# Patient Record
Sex: Female | Born: 1990 | Race: White | Hispanic: No | Marital: Single | Smoking: Current every day smoker
Health system: Southern US, Community
[De-identification: ages and names within clinical notes are randomized; demographics above are authoritative.]

## PROBLEM LIST (undated history)

## (undated) DIAGNOSIS — G8220 Paraplegia, unspecified: Secondary | ICD-10-CM

## (undated) DIAGNOSIS — N319 Neuromuscular dysfunction of bladder, unspecified: Secondary | ICD-10-CM

## (undated) DIAGNOSIS — K592 Neurogenic bowel, not elsewhere classified: Secondary | ICD-10-CM

## (undated) DIAGNOSIS — N318 Other neuromuscular dysfunction of bladder: Secondary | ICD-10-CM

## (undated) DIAGNOSIS — E669 Obesity, unspecified: Secondary | ICD-10-CM

## (undated) DIAGNOSIS — G959 Disease of spinal cord, unspecified: Secondary | ICD-10-CM

## (undated) HISTORY — PX: ANTERIOR CRUCIATE LIGAMENT REPAIR: SHX115

---

## 2015-11-03 DIAGNOSIS — K592 Neurogenic bowel, not elsewhere classified: Secondary | ICD-10-CM

## 2015-11-03 HISTORY — DX: Neurogenic bowel, not elsewhere classified: K59.2

## 2015-11-06 ENCOUNTER — Encounter (HOSPITAL_COMMUNITY): Admission: EM | Disposition: A | Discharge status: Rehabilitation Facility | Payer: Self-pay | Source: Home / Self Care

## 2015-11-06 ENCOUNTER — Inpatient Hospital Stay (HOSPITAL_COMMUNITY): Payer: Worker's Compensation

## 2015-11-06 ENCOUNTER — Encounter (HOSPITAL_COMMUNITY): Payer: Self-pay | Admitting: Radiology

## 2015-11-06 ENCOUNTER — Emergency Department (HOSPITAL_COMMUNITY): Payer: Worker's Compensation

## 2015-11-06 ENCOUNTER — Inpatient Hospital Stay (HOSPITAL_COMMUNITY): Payer: Worker's Compensation | Admitting: Certified Registered Nurse Anesthetist

## 2015-11-06 ENCOUNTER — Inpatient Hospital Stay (HOSPITAL_COMMUNITY)
Admission: EM | Admit: 2015-11-06 | Discharge: 2015-11-12 | DRG: 460 | Disposition: A | Discharge status: Rehabilitation Facility | Payer: Worker's Compensation | Attending: Neurosurgery | Admitting: Neurosurgery

## 2015-11-06 DIAGNOSIS — D62 Acute posthemorrhagic anemia: Secondary | ICD-10-CM | POA: Diagnosis not present

## 2015-11-06 DIAGNOSIS — S32039A Unspecified fracture of third lumbar vertebra, initial encounter for closed fracture: Secondary | ICD-10-CM | POA: Diagnosis present

## 2015-11-06 DIAGNOSIS — M25572 Pain in left ankle and joints of left foot: Secondary | ICD-10-CM | POA: Diagnosis present

## 2015-11-06 DIAGNOSIS — S32009A Unspecified fracture of unspecified lumbar vertebra, initial encounter for closed fracture: Secondary | ICD-10-CM | POA: Diagnosis present

## 2015-11-06 DIAGNOSIS — R402142 Coma scale, eyes open, spontaneous, at arrival to emergency department: Secondary | ICD-10-CM | POA: Diagnosis present

## 2015-11-06 DIAGNOSIS — F4321 Adjustment disorder with depressed mood: Secondary | ICD-10-CM | POA: Diagnosis not present

## 2015-11-06 DIAGNOSIS — S22009A Unspecified fracture of unspecified thoracic vertebra, initial encounter for closed fracture: Secondary | ICD-10-CM

## 2015-11-06 DIAGNOSIS — T1490XA Injury, unspecified, initial encounter: Secondary | ICD-10-CM

## 2015-11-06 DIAGNOSIS — F17210 Nicotine dependence, cigarettes, uncomplicated: Secondary | ICD-10-CM | POA: Diagnosis present

## 2015-11-06 DIAGNOSIS — R402362 Coma scale, best motor response, obeys commands, at arrival to emergency department: Secondary | ICD-10-CM | POA: Diagnosis present

## 2015-11-06 DIAGNOSIS — S32029A Unspecified fracture of second lumbar vertebra, initial encounter for closed fracture: Secondary | ICD-10-CM | POA: Diagnosis present

## 2015-11-06 DIAGNOSIS — Z6841 Body Mass Index (BMI) 40.0 and over, adult: Secondary | ICD-10-CM | POA: Diagnosis not present

## 2015-11-06 DIAGNOSIS — S30811A Abrasion of abdominal wall, initial encounter: Secondary | ICD-10-CM | POA: Diagnosis present

## 2015-11-06 DIAGNOSIS — R402252 Coma scale, best verbal response, oriented, at arrival to emergency department: Secondary | ICD-10-CM | POA: Diagnosis present

## 2015-11-06 DIAGNOSIS — T07XXXA Unspecified multiple injuries, initial encounter: Secondary | ICD-10-CM

## 2015-11-06 DIAGNOSIS — N83209 Unspecified ovarian cyst, unspecified side: Secondary | ICD-10-CM | POA: Diagnosis present

## 2015-11-06 DIAGNOSIS — S32019A Unspecified fracture of first lumbar vertebra, initial encounter for closed fracture: Secondary | ICD-10-CM | POA: Diagnosis present

## 2015-11-06 DIAGNOSIS — S060X0A Concussion without loss of consciousness, initial encounter: Secondary | ICD-10-CM | POA: Diagnosis present

## 2015-11-06 DIAGNOSIS — R339 Retention of urine, unspecified: Secondary | ICD-10-CM | POA: Diagnosis present

## 2015-11-06 DIAGNOSIS — G9581 Conus medullaris syndrome: Secondary | ICD-10-CM | POA: Diagnosis present

## 2015-11-06 DIAGNOSIS — S22081D Stable burst fracture of T11-T12 vertebra, subsequent encounter for fracture with routine healing: Secondary | ICD-10-CM | POA: Diagnosis not present

## 2015-11-06 DIAGNOSIS — G4700 Insomnia, unspecified: Secondary | ICD-10-CM | POA: Diagnosis present

## 2015-11-06 DIAGNOSIS — M549 Dorsalgia, unspecified: Secondary | ICD-10-CM | POA: Diagnosis present

## 2015-11-06 DIAGNOSIS — R413 Other amnesia: Secondary | ICD-10-CM | POA: Diagnosis present

## 2015-11-06 DIAGNOSIS — N390 Urinary tract infection, site not specified: Secondary | ICD-10-CM | POA: Diagnosis not present

## 2015-11-06 DIAGNOSIS — K560 Paralytic ileus: Secondary | ICD-10-CM | POA: Diagnosis not present

## 2015-11-06 DIAGNOSIS — S32049A Unspecified fracture of fourth lumbar vertebra, initial encounter for closed fracture: Secondary | ICD-10-CM | POA: Diagnosis present

## 2015-11-06 DIAGNOSIS — Z981 Arthrodesis status: Secondary | ICD-10-CM | POA: Diagnosis not present

## 2015-11-06 DIAGNOSIS — K567 Ileus, unspecified: Secondary | ICD-10-CM

## 2015-11-06 DIAGNOSIS — G8220 Paraplegia, unspecified: Secondary | ICD-10-CM | POA: Diagnosis present

## 2015-11-06 DIAGNOSIS — S060X9A Concussion with loss of consciousness of unspecified duration, initial encounter: Secondary | ICD-10-CM | POA: Diagnosis present

## 2015-11-06 DIAGNOSIS — S22081A Stable burst fracture of T11-T12 vertebra, initial encounter for closed fracture: Secondary | ICD-10-CM | POA: Diagnosis present

## 2015-11-06 DIAGNOSIS — S22081S Stable burst fracture of T11-T12 vertebra, sequela: Secondary | ICD-10-CM | POA: Diagnosis not present

## 2015-11-06 DIAGNOSIS — G839 Paralytic syndrome, unspecified: Secondary | ICD-10-CM

## 2015-11-06 DIAGNOSIS — G834 Cauda equina syndrome: Secondary | ICD-10-CM | POA: Diagnosis not present

## 2015-11-06 DIAGNOSIS — Z419 Encounter for procedure for purposes other than remedying health state, unspecified: Secondary | ICD-10-CM

## 2015-11-06 DIAGNOSIS — K592 Neurogenic bowel, not elsewhere classified: Secondary | ICD-10-CM | POA: Diagnosis present

## 2015-11-06 DIAGNOSIS — N319 Neuromuscular dysfunction of bladder, unspecified: Secondary | ICD-10-CM | POA: Diagnosis not present

## 2015-11-06 DIAGNOSIS — G959 Disease of spinal cord, unspecified: Secondary | ICD-10-CM | POA: Diagnosis not present

## 2015-11-06 DIAGNOSIS — S01402A Unspecified open wound of left cheek and temporomandibular area, initial encounter: Secondary | ICD-10-CM | POA: Diagnosis present

## 2015-11-06 DIAGNOSIS — B9620 Unspecified Escherichia coli [E. coli] as the cause of diseases classified elsewhere: Secondary | ICD-10-CM | POA: Diagnosis not present

## 2015-11-06 DIAGNOSIS — R609 Edema, unspecified: Secondary | ICD-10-CM | POA: Diagnosis not present

## 2015-11-06 DIAGNOSIS — G8310 Monoplegia of lower limb affecting unspecified side: Secondary | ICD-10-CM

## 2015-11-06 HISTORY — PX: POSTERIOR LUMBAR FUSION 4 LEVEL: SHX6037

## 2015-11-06 LAB — PREPARE FRESH FROZEN PLASMA
Unit division: 0
Unit division: 0

## 2015-11-06 LAB — URINALYSIS, ROUTINE W REFLEX MICROSCOPIC
Bilirubin Urine: NEGATIVE
GLUCOSE, UA: NEGATIVE mg/dL
KETONES UR: NEGATIVE mg/dL
LEUKOCYTES UA: NEGATIVE
Nitrite: NEGATIVE
PH: 5 (ref 5.0–8.0)
Protein, ur: NEGATIVE mg/dL
Specific Gravity, Urine: >1.046 — ABNORMAL HIGH (ref 1.005–1.030)

## 2015-11-06 LAB — TYPE AND SCREEN
ABO/RH(D): B POS
ANTIBODY SCREEN: NEGATIVE
UNIT DIVISION: 0
Unit division: 0

## 2015-11-06 LAB — COMPREHENSIVE METABOLIC PANEL
ALBUMIN: 4 g/dL (ref 3.5–5.0)
ALK PHOS: 65 U/L (ref 38–126)
ALT: 31 U/L (ref 14–54)
ANION GAP: 12 (ref 5–15)
AST: 47 U/L — ABNORMAL HIGH (ref 15–41)
BILIRUBIN TOTAL: 0.3 mg/dL (ref 0.3–1.2)
BUN: 14 mg/dL (ref 6–20)
CALCIUM: 9.7 mg/dL (ref 8.9–10.3)
CO2: 22 mmol/L (ref 22–32)
Chloride: 106 mmol/L (ref 101–111)
Creatinine, Ser: 0.74 mg/dL (ref 0.44–1.00)
GFR calc non Af Amer: >60 mL/min (ref >60)
GLUCOSE: 119 mg/dL — AB (ref 65–99)
Potassium: 3.3 mmol/L — ABNORMAL LOW (ref 3.5–5.1)
Sodium: 140 mmol/L (ref 135–145)
TOTAL PROTEIN: 7.4 g/dL (ref 6.5–8.1)

## 2015-11-06 LAB — CBC
HEMATOCRIT: 41 % (ref 36.0–46.0)
HEMOGLOBIN: 13.6 g/dL (ref 12.0–15.0)
MCH: 28.3 pg (ref 26.0–34.0)
MCHC: 33.2 g/dL (ref 30.0–36.0)
MCV: 85.4 fL (ref 78.0–100.0)
Platelets: 291 10*3/uL (ref 150–400)
RBC: 4.8 MIL/uL (ref 3.87–5.11)
RDW: 14.1 % (ref 11.5–15.5)
WBC: 17 10*3/uL — ABNORMAL HIGH (ref 4.0–10.5)

## 2015-11-06 LAB — URINE MICROSCOPIC-ADD ON: WBC, UA: NONE SEEN WBC/hpf (ref 0–5)

## 2015-11-06 LAB — ETHANOL: Alcohol, Ethyl (B): <5 mg/dL (ref <5)

## 2015-11-06 LAB — CDS SEROLOGY

## 2015-11-06 LAB — PROTIME-INR
INR: 1.02 (ref 0.00–1.49)
PROTHROMBIN TIME: 13.6 s (ref 11.6–15.2)

## 2015-11-06 LAB — MRSA PCR SCREENING: MRSA by PCR: NEGATIVE

## 2015-11-06 LAB — ABO/RH: ABO/RH(D): B POS

## 2015-11-06 SURGERY — POSTERIOR LUMBAR FUSION 4 LEVEL
Anesthesia: General

## 2015-11-06 MED ORDER — FENTANYL CITRATE (PF) 250 MCG/5ML IJ SOLN
INTRAMUSCULAR | Status: AC
  Filled 2015-11-06: qty 5

## 2015-11-06 MED ORDER — LIDOCAINE HCL (CARDIAC) 20 MG/ML IV SOLN
INTRAVENOUS | Status: DC | PRN
  Administered 2015-11-06: 30 mg via INTRAVENOUS

## 2015-11-06 MED ORDER — DIPHENHYDRAMINE HCL 50 MG/ML IJ SOLN: 12.5000 mg | Freq: Four times a day (QID) | INTRAMUSCULAR | Status: DC | PRN

## 2015-11-06 MED ORDER — ONDANSETRON HCL 4 MG/2ML IJ SOLN
4.0000 mg | Freq: Four times a day (QID) | INTRAMUSCULAR | Status: DC | PRN
  Administered 2015-11-08 – 2015-11-10 (×3): 4 mg via INTRAVENOUS
  Filled 2015-11-06 (×3): qty 2

## 2015-11-06 MED ORDER — SENNOSIDES-DOCUSATE SODIUM 8.6-50 MG PO TABS: 1.0000 | ORAL_TABLET | Freq: Every evening | ORAL | Status: DC | PRN

## 2015-11-06 MED ORDER — BUPIVACAINE HCL (PF) 0.5 % IJ SOLN
INTRAMUSCULAR | Status: DC | PRN
  Administered 2015-11-06: 30 mL

## 2015-11-06 MED ORDER — SODIUM CHLORIDE 0.9 % IJ SOLN
3.0000 mL | Freq: Two times a day (BID) | INTRAMUSCULAR | Status: DC
  Administered 2015-11-06 – 2015-11-12 (×8): 3 mL via INTRAVENOUS

## 2015-11-06 MED ORDER — SODIUM CHLORIDE 0.9 % IV SOLN: 250.0000 mL | INTRAVENOUS | Status: DC

## 2015-11-06 MED ORDER — CEFAZOLIN SODIUM-DEXTROSE 2-3 GM-% IV SOLR
INTRAVENOUS | Status: DC | PRN
  Administered 2015-11-06: 2 g via INTRAVENOUS

## 2015-11-06 MED ORDER — ONDANSETRON HCL 4 MG PO TABS: 4.0000 mg | ORAL_TABLET | Freq: Four times a day (QID) | ORAL | Status: DC | PRN

## 2015-11-06 MED ORDER — PANTOPRAZOLE SODIUM 40 MG PO TBEC
40.0000 mg | DELAYED_RELEASE_TABLET | Freq: Every day | ORAL | Status: DC
  Administered 2015-11-07 – 2015-11-12 (×6): 40 mg via ORAL
  Filled 2015-11-06 (×6): qty 1

## 2015-11-06 MED ORDER — ONDANSETRON HCL 4 MG/2ML IJ SOLN
INTRAMUSCULAR | Status: AC
  Filled 2015-11-06: qty 2

## 2015-11-06 MED ORDER — DOCUSATE SODIUM 100 MG PO CAPS
100.0000 mg | ORAL_CAPSULE | Freq: Two times a day (BID) | ORAL | Status: DC
  Administered 2015-11-06 – 2015-11-12 (×12): 100 mg via ORAL
  Filled 2015-11-06 (×12): qty 1

## 2015-11-06 MED ORDER — LACTATED RINGERS IV SOLN
INTRAVENOUS | Status: DC | PRN
  Administered 2015-11-06 (×2): via INTRAVENOUS

## 2015-11-06 MED ORDER — THROMBIN 20000 UNITS EX SOLR
CUTANEOUS | Status: DC | PRN
  Administered 2015-11-06: 12:00:00 via TOPICAL

## 2015-11-06 MED ORDER — MIDAZOLAM HCL 2 MG/2ML IJ SOLN
INTRAMUSCULAR | Status: AC
  Filled 2015-11-06: qty 2

## 2015-11-06 MED ORDER — MIDAZOLAM HCL 5 MG/5ML IJ SOLN
INTRAMUSCULAR | Status: DC | PRN
  Administered 2015-11-06: 1 mg via INTRAVENOUS

## 2015-11-06 MED ORDER — MORPHINE SULFATE (PF) 2 MG/ML IV SOLN: 1.0000 mg | INTRAVENOUS | Status: DC | PRN

## 2015-11-06 MED ORDER — MIDAZOLAM HCL 2 MG/2ML IJ SOLN: 0.5000 mg | Freq: Once | INTRAMUSCULAR | Status: DC | PRN

## 2015-11-06 MED ORDER — HYDROCODONE-ACETAMINOPHEN 5-325 MG PO TABS
1.0000 | ORAL_TABLET | ORAL | Status: DC | PRN
  Administered 2015-11-07: 2 via ORAL
  Administered 2015-11-07: 1 via ORAL
  Administered 2015-11-08 – 2015-11-10 (×3): 2 via ORAL
  Filled 2015-11-06: qty 1
  Filled 2015-11-06 (×4): qty 2

## 2015-11-06 MED ORDER — FENTANYL CITRATE (PF) 100 MCG/2ML IJ SOLN
INTRAMUSCULAR | Status: DC | PRN
  Administered 2015-11-06: 50 ug via INTRAVENOUS
  Administered 2015-11-06: 100 ug via INTRAVENOUS
  Administered 2015-11-06: 50 ug via INTRAVENOUS
  Administered 2015-11-06: 100 ug via INTRAVENOUS
  Administered 2015-11-06: 50 ug via INTRAVENOUS
  Administered 2015-11-06: 100 ug via INTRAVENOUS
  Administered 2015-11-06: 150 ug via INTRAVENOUS

## 2015-11-06 MED ORDER — FENTANYL CITRATE (PF) 100 MCG/2ML IJ SOLN: 100.0000 ug | Freq: Once | INTRAMUSCULAR | Status: DC

## 2015-11-06 MED ORDER — NALOXONE HCL 0.4 MG/ML IJ SOLN: 0.4000 mg | INTRAMUSCULAR | Status: DC | PRN

## 2015-11-06 MED ORDER — PANTOPRAZOLE SODIUM 40 MG IV SOLR: 40.0000 mg | Freq: Every day | INTRAVENOUS | Status: DC

## 2015-11-06 MED ORDER — BACITRACIN ZINC 500 UNIT/GM EX OINT
TOPICAL_OINTMENT | CUTANEOUS | Status: DC | PRN
  Administered 2015-11-06: 1 via TOPICAL

## 2015-11-06 MED ORDER — ROCURONIUM BROMIDE 100 MG/10ML IV SOLN
INTRAVENOUS | Status: DC | PRN
  Administered 2015-11-06: 50 mg via INTRAVENOUS

## 2015-11-06 MED ORDER — DEXAMETHASONE SODIUM PHOSPHATE 10 MG/ML IJ SOLN
INTRAMUSCULAR | Status: DC | PRN
  Administered 2015-11-06: 10 mg via INTRAVENOUS

## 2015-11-06 MED ORDER — PHENOL 1.4 % MT LIQD: 1.0000 | OROMUCOSAL | Status: DC | PRN

## 2015-11-06 MED ORDER — MAGNESIUM CITRATE PO SOLN
1.0000 | Freq: Once | ORAL | Status: DC | PRN
Start: 2015-11-06 — End: 2015-11-12
  Filled 2015-11-06: qty 296

## 2015-11-06 MED ORDER — HYDROMORPHONE HCL 1 MG/ML IJ SOLN: 0.2500 mg | INTRAMUSCULAR | Status: DC | PRN

## 2015-11-06 MED ORDER — FENTANYL CITRATE (PF) 100 MCG/2ML IJ SOLN
INTRAMUSCULAR | Status: AC
  Filled 2015-11-06: qty 2

## 2015-11-06 MED ORDER — 0.9 % SODIUM CHLORIDE (POUR BTL) OPTIME
TOPICAL | Status: DC | PRN
  Administered 2015-11-06: 1000 mL

## 2015-11-06 MED ORDER — LIDOCAINE HCL (CARDIAC) 20 MG/ML IV SOLN
INTRAVENOUS | Status: AC
  Filled 2015-11-06: qty 5

## 2015-11-06 MED ORDER — ONDANSETRON HCL 4 MG/2ML IJ SOLN: 4.0000 mg | Freq: Once | INTRAMUSCULAR | Status: DC

## 2015-11-06 MED ORDER — NEOSTIGMINE METHYLSULFATE 10 MG/10ML IV SOLN
INTRAVENOUS | Status: DC | PRN
  Administered 2015-11-06: 3 mg via INTRAVENOUS

## 2015-11-06 MED ORDER — ONDANSETRON HCL 4 MG/2ML IJ SOLN
INTRAMUSCULAR | Status: AC
Start: 2015-11-06 — End: 2015-11-06
  Filled 2015-11-06: qty 2

## 2015-11-06 MED ORDER — SODIUM CHLORIDE 0.9 % IJ SOLN: 9.0000 mL | INTRAMUSCULAR | Status: DC | PRN

## 2015-11-06 MED ORDER — ZOLPIDEM TARTRATE 5 MG PO TABS
5.0000 mg | ORAL_TABLET | Freq: Every evening | ORAL | Status: DC | PRN
  Administered 2015-11-11: 5 mg via ORAL
  Filled 2015-11-06: qty 1

## 2015-11-06 MED ORDER — SENNA 8.6 MG PO TABS
1.0000 | ORAL_TABLET | Freq: Two times a day (BID) | ORAL | Status: DC
  Administered 2015-11-06 – 2015-11-12 (×11): 8.6 mg via ORAL
  Filled 2015-11-06 (×12): qty 1

## 2015-11-06 MED ORDER — PROPOFOL 10 MG/ML IV BOLUS
INTRAVENOUS | Status: AC
  Filled 2015-11-06: qty 20

## 2015-11-06 MED ORDER — PROMETHAZINE HCL 25 MG/ML IJ SOLN
6.2500 mg | INTRAMUSCULAR | Status: DC | PRN
Start: 2015-11-06 — End: 2015-11-06

## 2015-11-06 MED ORDER — LIDOCAINE-EPINEPHRINE 0.5 %-1:200000 IJ SOLN
INTRAMUSCULAR | Status: DC | PRN
  Administered 2015-11-06: 20 mL

## 2015-11-06 MED ORDER — DIAZEPAM 5 MG PO TABS
5.0000 mg | ORAL_TABLET | Freq: Four times a day (QID) | ORAL | Status: DC | PRN
  Administered 2015-11-07 – 2015-11-12 (×8): 5 mg via ORAL
  Filled 2015-11-06 (×8): qty 1

## 2015-11-06 MED ORDER — KCL IN DEXTROSE-NACL 40-5-0.45 MEQ/L-%-% IV SOLN
INTRAVENOUS | Status: DC
  Administered 2015-11-06 – 2015-11-08 (×4): via INTRAVENOUS
  Filled 2015-11-06 (×8): qty 1000

## 2015-11-06 MED ORDER — MORPHINE SULFATE 2 MG/ML IV SOLN
INTRAVENOUS | Status: DC
  Administered 2015-11-06 (×2): via INTRAVENOUS
  Administered 2015-11-06: 1.5 mg via INTRAVENOUS
  Administered 2015-11-07: 9 mg via INTRAVENOUS
  Administered 2015-11-07: 8 mg via INTRAVENOUS
  Administered 2015-11-07: 6 mg via INTRAVENOUS
  Administered 2015-11-07: 9 mg via INTRAVENOUS
  Administered 2015-11-07: 12:00:00 via INTRAVENOUS
  Administered 2015-11-07: 12 mg via INTRAVENOUS
  Administered 2015-11-07: 10.5 mg via INTRAVENOUS
  Administered 2015-11-08: 6 mg via INTRAVENOUS
  Administered 2015-11-08: 05:00:00 via INTRAVENOUS
  Administered 2015-11-08: 10.5 mg via INTRAVENOUS
  Administered 2015-11-08: 17.3 mg via INTRAVENOUS
  Administered 2015-11-08: 32 mg via INTRAVENOUS
  Administered 2015-11-08: 21:00:00 via INTRAVENOUS
  Administered 2015-11-08 – 2015-11-09 (×2): 7.5 mg via INTRAVENOUS
  Administered 2015-11-09: 21 mg via INTRAVENOUS
  Administered 2015-11-09: 9 mg via INTRAVENOUS
  Administered 2015-11-09: 17:00:00 via INTRAVENOUS
  Administered 2015-11-09: 0 mg via INTRAVENOUS
  Administered 2015-11-09: 12 mg via INTRAVENOUS
  Administered 2015-11-09: 10.29 mg via INTRAVENOUS
  Administered 2015-11-10: 6 mg via INTRAVENOUS
  Administered 2015-11-10: 19.5 mg via INTRAVENOUS
  Filled 2015-11-06 (×5): qty 25

## 2015-11-06 MED ORDER — MORPHINE SULFATE 2 MG/ML IV SOLN
INTRAVENOUS | Status: AC
  Filled 2015-11-06: qty 25

## 2015-11-06 MED ORDER — GLYCOPYRROLATE 0.2 MG/ML IJ SOLN
INTRAMUSCULAR | Status: DC | PRN
  Administered 2015-11-06: 0.4 mg via INTRAVENOUS

## 2015-11-06 MED ORDER — MEPERIDINE HCL 25 MG/ML IJ SOLN: 6.2500 mg | INTRAMUSCULAR | Status: DC | PRN

## 2015-11-06 MED ORDER — POLYETHYLENE GLYCOL 3350 17 G PO PACK
17.0000 g | PACK | Freq: Every day | ORAL | Status: DC
  Administered 2015-11-07 – 2015-11-12 (×5): 17 g via ORAL
  Filled 2015-11-06 (×6): qty 1

## 2015-11-06 MED ORDER — ACETAMINOPHEN 650 MG RE SUPP: 650.0000 mg | RECTAL | Status: DC | PRN

## 2015-11-06 MED ORDER — DIPHENHYDRAMINE HCL 12.5 MG/5ML PO ELIX: 12.5000 mg | ORAL_SOLUTION | Freq: Four times a day (QID) | ORAL | Status: DC | PRN

## 2015-11-06 MED ORDER — SODIUM CHLORIDE 0.9 % IJ SOLN
3.0000 mL | INTRAMUSCULAR | Status: DC | PRN
  Administered 2015-11-10: 3 mL via INTRAVENOUS
  Filled 2015-11-06: qty 3

## 2015-11-06 MED ORDER — ENOXAPARIN SODIUM 30 MG/0.3ML ~~LOC~~ SOLN: 30.0000 mg | Freq: Two times a day (BID) | SUBCUTANEOUS | Status: DC

## 2015-11-06 MED ORDER — SUCCINYLCHOLINE CHLORIDE 20 MG/ML IJ SOLN
INTRAMUSCULAR | Status: DC | PRN
  Administered 2015-11-06: 140 mg via INTRAVENOUS

## 2015-11-06 MED ORDER — ACETAMINOPHEN 325 MG PO TABS
650.0000 mg | ORAL_TABLET | ORAL | Status: DC | PRN
  Administered 2015-11-10 – 2015-11-12 (×3): 650 mg via ORAL
  Filled 2015-11-06 (×3): qty 2

## 2015-11-06 MED ORDER — ONDANSETRON HCL 4 MG/2ML IJ SOLN
INTRAMUSCULAR | Status: DC | PRN
  Administered 2015-11-06: 4 mg via INTRAVENOUS

## 2015-11-06 MED ORDER — FENTANYL CITRATE (PF) 100 MCG/2ML IJ SOLN
INTRAMUSCULAR | Status: AC | PRN
  Administered 2015-11-06 (×2): 100 ug via INTRAVENOUS

## 2015-11-06 MED ORDER — BISACODYL 5 MG PO TBEC: 5.0000 mg | DELAYED_RELEASE_TABLET | Freq: Every day | ORAL | Status: DC | PRN

## 2015-11-06 MED ORDER — MENTHOL 3 MG MT LOZG: 1.0000 | LOZENGE | OROMUCOSAL | Status: DC | PRN

## 2015-11-06 MED ORDER — ALUM & MAG HYDROXIDE-SIMETH 200-200-20 MG/5ML PO SUSP: 30.0000 mL | Freq: Four times a day (QID) | ORAL | Status: DC | PRN

## 2015-11-06 MED ORDER — ROCURONIUM BROMIDE 50 MG/5ML IV SOLN
INTRAVENOUS | Status: AC
Start: 2015-11-06 — End: 2015-11-06
  Filled 2015-11-06: qty 1

## 2015-11-06 MED ORDER — PROPOFOL 10 MG/ML IV BOLUS
INTRAVENOUS | Status: DC | PRN
  Administered 2015-11-06: 100 mg via INTRAVENOUS

## 2015-11-06 MED ORDER — IOHEXOL 300 MG/ML  SOLN
100.0000 mL | Freq: Once | INTRAMUSCULAR | Status: AC | PRN
  Administered 2015-11-06: 100 mL via INTRAVENOUS

## 2015-11-06 SURGICAL SUPPLY — 66 items
BAG DECANTER FOR FLEXI CONT (MISCELLANEOUS) ×3 IMPLANT
BENZOIN TINCTURE PRP APPL 2/3 (GAUZE/BANDAGES/DRESSINGS) IMPLANT
BLADE CLIPPER SURG (BLADE) IMPLANT
BUR MATCHSTICK NEURO 3.0 LAGG (BURR) ×3 IMPLANT
BUR PRECISION FLUTE 5.0 (BURR) ×3 IMPLANT
CANISTER SUCT 3000ML PPV (MISCELLANEOUS) ×3 IMPLANT
CAP LOCKING THREADED (Cap) ×24 IMPLANT
CLOSURE WOUND 1/2 X4 (GAUZE/BANDAGES/DRESSINGS)
CONT SPEC 4OZ CLIKSEAL STRL BL (MISCELLANEOUS) ×3 IMPLANT
COVER BACK TABLE 60X90IN (DRAPES) IMPLANT
DECANTER SPIKE VIAL GLASS SM (MISCELLANEOUS) ×3 IMPLANT
DRAPE C-ARM 42X72 X-RAY (DRAPES) ×6 IMPLANT
DRAPE C-ARMOR (DRAPES) ×3 IMPLANT
DRAPE LAPAROTOMY 100X72X124 (DRAPES) ×3 IMPLANT
DRAPE POUCH INSTRU U-SHP 10X18 (DRAPES) ×3 IMPLANT
DRAPE SURG 17X23 STRL (DRAPES) ×3 IMPLANT
DRSG ADAPTIC 3X8 NADH LF (GAUZE/BANDAGES/DRESSINGS) ×6 IMPLANT
DRSG OPSITE POSTOP 4X10 (GAUZE/BANDAGES/DRESSINGS) ×3 IMPLANT
DURAPREP 26ML APPLICATOR (WOUND CARE) ×3 IMPLANT
ELECT BLADE 4.0 EZ CLEAN MEGAD (MISCELLANEOUS) ×3
ELECT REM PT RETURN 9FT ADLT (ELECTROSURGICAL) ×3
ELECTRODE BLDE 4.0 EZ CLN MEGD (MISCELLANEOUS) ×1 IMPLANT
ELECTRODE REM PT RTRN 9FT ADLT (ELECTROSURGICAL) ×1 IMPLANT
GAUZE SPONGE 4X4 12PLY STRL (GAUZE/BANDAGES/DRESSINGS) IMPLANT
GAUZE SPONGE 4X4 16PLY XRAY LF (GAUZE/BANDAGES/DRESSINGS) IMPLANT
GLOVE BIO SURGEON STRL SZ7 (GLOVE) ×9 IMPLANT
GLOVE ECLIPSE 6.5 STRL STRAW (GLOVE) ×6 IMPLANT
GLOVE EXAM NITRILE LRG STRL (GLOVE) IMPLANT
GLOVE EXAM NITRILE MD LF STRL (GLOVE) IMPLANT
GLOVE EXAM NITRILE XL STR (GLOVE) IMPLANT
GLOVE EXAM NITRILE XS STR PU (GLOVE) IMPLANT
GLOVE INDICATOR 7.0 STRL GRN (GLOVE) ×9 IMPLANT
GLOVE INDICATOR 7.5 STRL GRN (GLOVE) ×6 IMPLANT
GOWN STRL REUS W/ TWL LRG LVL3 (GOWN DISPOSABLE) ×2 IMPLANT
GOWN STRL REUS W/ TWL XL LVL3 (GOWN DISPOSABLE) IMPLANT
GOWN STRL REUS W/TWL 2XL LVL3 (GOWN DISPOSABLE) IMPLANT
GOWN STRL REUS W/TWL LRG LVL3 (GOWN DISPOSABLE) ×4
GOWN STRL REUS W/TWL XL LVL3 (GOWN DISPOSABLE)
GRAFT BN 10X1XDBM MAGNIFUSE (Bone Implant) ×1 IMPLANT
GRAFT BONE MAGNIFUSE 1X10CM (Bone Implant) ×2 IMPLANT
KIT BASIN OR (CUSTOM PROCEDURE TRAY) ×3 IMPLANT
KIT POSITION SURG JACKSON T1 (MISCELLANEOUS) ×3 IMPLANT
KIT ROOM TURNOVER OR (KITS) ×3 IMPLANT
LIQUID BAND (GAUZE/BANDAGES/DRESSINGS) ×3 IMPLANT
MILL MEDIUM DISP (BLADE) ×6 IMPLANT
NEEDLE HYPO 25X1 1.5 SAFETY (NEEDLE) ×3 IMPLANT
NEEDLE SPNL 18GX3.5 QUINCKE PK (NEEDLE) IMPLANT
NS IRRIG 1000ML POUR BTL (IV SOLUTION) ×3 IMPLANT
PACK LAMINECTOMY NEURO (CUSTOM PROCEDURE TRAY) ×3 IMPLANT
PAD ARMBOARD 7.5X6 YLW CONV (MISCELLANEOUS) ×9 IMPLANT
ROD CREO STRAIGHT 125MM (Rod) ×6 IMPLANT
SCREW 5.0X40MM (Screw) ×3 IMPLANT
SCREW POLY THRD CREO 6.5X40 (Screw) ×21 IMPLANT
SPONGE LAP 4X18 X RAY DECT (DISPOSABLE) IMPLANT
SPONGE SURGIFOAM ABS GEL 100 (HEMOSTASIS) ×3 IMPLANT
STAPLER VISISTAT 35W (STAPLE) ×3 IMPLANT
STRIP CLOSURE SKIN 1/2X4 (GAUZE/BANDAGES/DRESSINGS) IMPLANT
SUT ETHILON 3 0 PS 1 (SUTURE) ×3 IMPLANT
SUT PROLENE 6 0 BV (SUTURE) IMPLANT
SUT VIC AB 0 CT1 18XCR BRD8 (SUTURE) ×2 IMPLANT
SUT VIC AB 0 CT1 8-18 (SUTURE) ×4
SUT VIC AB 2-0 CT1 18 (SUTURE) ×6 IMPLANT
SUT VIC AB 3-0 SH 8-18 (SUTURE) ×6 IMPLANT
TOWEL OR 17X24 6PK STRL BLUE (TOWEL DISPOSABLE) ×3 IMPLANT
TOWEL OR 17X26 10 PK STRL BLUE (TOWEL DISPOSABLE) ×3 IMPLANT
WATER STERILE IRR 1000ML POUR (IV SOLUTION) ×3 IMPLANT

## 2015-11-06 NOTE — Anesthesia Postprocedure Evaluation (Signed)
Anesthesia Post Note  Patient: Marie [name]  Procedure(s) Performed: Procedure(s) (LRB): Open Reduction Internal Fixation THORACIC TEN TO LUMBAR TWO  (N/A)  Patient location during evaluation: PACU Anesthesia Type: General Level of consciousness: awake and alert, oriented and patient cooperative Pain management: pain level controlled Vital Signs Assessment: post-procedure vital signs reviewed and stable Respiratory status: spontaneous breathing, nonlabored ventilation, respiratory function stable and patient connected to nasal cannula oxygen Cardiovascular status: blood pressure returned to baseline and stable Postop Assessment: no signs of nausea or vomiting (pt remains unable to move her feet, but she can sense touch to her feet) Anesthetic complications: no    Last Vitals:  Filed Vitals:   11/06/15 1715 11/06/15 1725  BP:  128/71  Pulse:  85  Temp: 36.4 C   Resp:      Last Pain:  Filed Vitals:   11/06/15 1730  PainSc: 2                  Syann Cupples,E. Malaak Stach
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
## 2015-11-06 NOTE — ED Notes (Signed)
Returned from ct scan 
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
## 2015-11-06 NOTE — Progress Notes (Signed)
Patient transferred to OR for surgical procedure.
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
## 2015-11-06 NOTE — Anesthesia Preprocedure Evaluation (Addendum)
Anesthesia Evaluation  Patient identified by MRN, date of birth, ID band Patient awake    Reviewed: Allergy & Precautions, NPO status , Patient's Chart, lab work & pertinent test results  History of Anesthesia Complications Negative for: history of anesthetic complications  Airway Mallampati: I  TM Distance: >3 FB Neck ROM: Full    Dental  (+) Dental Advisory Given   Pulmonary Current Smoker,    breath sounds clear to auscultation       Cardiovascular negative cardio ROS   Rhythm:Regular Rate:Normal     Neuro/Psych T12 burst fracture: Bilat LE paresis Lumbar transverse process fractures  C-spine cleared    GI/Hepatic negative GI ROS, Neg liver ROS,   Endo/Other  Morbid obesity  Renal/GU negative Renal ROS     Musculoskeletal negative musculoskeletal ROS (+)   Abdominal (+) + obese,   Peds  Hematology negative hematology ROS (+)   Anesthesia Other Findings   Reproductive/Obstetrics LMP 11/01/15                          Anesthesia Physical Anesthesia Plan  ASA: II and emergent  Anesthesia Plan: General   Post-op Pain Management:    Induction: Intravenous and Rapid sequence  Airway Management Planned: Oral ETT  Additional Equipment:   Intra-op Plan:   Post-operative Plan: Extubation in OR  Informed Consent: I have reviewed the patients History and Physical, chart, labs and discussed the procedure including the risks, benefits and alternatives for the proposed anesthesia with the patient or authorized representative who has indicated his/her understanding and acceptance.   Dental advisory given  Plan Discussed with: CRNA and Surgeon  Anesthesia Plan Comments: (Plan routine monitors, GETA)        Anesthesia Quick Evaluation

## 2015-11-06 NOTE — Transfer of Care (Signed)
Immediate Anesthesia Transfer of Care Note  Patient: Marie [name]  Procedure(s) Performed: Procedure(s) with comments: Open Reduction Internal Fixation THORACIC TEN TO LUMBAR TWO  (N/A) - Open Reduction Internal Fixation THORACIC TEN TO LUMBAR TWO   Patient Location: PACU  Anesthesia Type:General  Level of Consciousness: awake  Airway & Oxygen Therapy: Patient Spontanous Breathing and Patient connected to nasal cannula oxygen  Post-op Assessment: Report given to RN and Post -op Vital signs reviewed and stable  Post vital signs: Reviewed and stable  Last Vitals:  Filed Vitals:   11/06/15 1130 11/06/15 1610  BP: 105/58 139/80  Pulse: 75 82  Temp:  36.4 C  Resp: 22 23    Complications: No apparent anesthesia complications

## 2015-11-06 NOTE — Anesthesia Procedure Notes (Signed)
Procedure Name: Intubation Date/Time: 11/06/2015 12:07 PM Performed by: Orlinda BlalockMCMILLEN, Khyle Goodell L Pre-anesthesia Checklist: Patient identified, Emergency Drugs available, Suction available, Timeout performed and Patient being monitored Patient Re-evaluated:Patient Re-evaluated prior to inductionOxygen Delivery Method: Circle system utilized Intubation Type: Rapid sequence, Cricoid Pressure applied and IV induction Laryngoscope Size: Mac and 3 Grade View: Grade I Tube type: Oral Tube size: 7.5 mm Number of attempts: 1 Airway Equipment and Method: Stylet Placement Confirmation: ETT inserted through vocal cords under direct vision,  breath sounds checked- equal and bilateral and positive ETCO2 Secured at: 21 cm Tube secured with: Tape Dental Injury: Teeth and Oropharynx as per pre-operative assessment
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
## 2015-11-06 NOTE — ED Notes (Signed)
Report called to ICU
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
## 2015-11-07 ENCOUNTER — Encounter (HOSPITAL_COMMUNITY): Payer: Self-pay | Admitting: Neurosurgery

## 2015-11-07 ENCOUNTER — Inpatient Hospital Stay (HOSPITAL_COMMUNITY): Payer: Worker's Compensation

## 2015-11-07 LAB — CBC
HEMATOCRIT: 35.4 % — AB (ref 36.0–46.0)
HEMOGLOBIN: 11.6 g/dL — AB (ref 12.0–15.0)
MCH: 28.1 pg (ref 26.0–34.0)
MCHC: 32.8 g/dL (ref 30.0–36.0)
MCV: 85.7 fL (ref 78.0–100.0)
Platelets: 265 10*3/uL (ref 150–400)
RBC: 4.13 MIL/uL (ref 3.87–5.11)
RDW: 14.1 % (ref 11.5–15.5)
WBC: 15.7 10*3/uL — AB (ref 4.0–10.5)

## 2015-11-07 LAB — BLOOD PRODUCT ORDER (VERBAL) VERIFICATION

## 2015-11-07 LAB — BASIC METABOLIC PANEL
ANION GAP: 7 (ref 5–15)
BUN: 6 mg/dL (ref 6–20)
CHLORIDE: 107 mmol/L (ref 101–111)
CO2: 23 mmol/L (ref 22–32)
Calcium: 9 mg/dL (ref 8.9–10.3)
Creatinine, Ser: 0.46 mg/dL (ref 0.44–1.00)
GFR calc non Af Amer: >60 mL/min (ref >60)
Glucose, Bld: 122 mg/dL — ABNORMAL HIGH (ref 65–99)
Potassium: 4 mmol/L (ref 3.5–5.1)
SODIUM: 137 mmol/L (ref 135–145)

## 2015-11-07 MED ORDER — METHOCARBAMOL 1000 MG/10ML IJ SOLN
1000.0000 mg | Freq: Three times a day (TID) | INTRAVENOUS | Status: DC | PRN
  Administered 2015-11-08 (×2): 1000 mg via INTRAVENOUS
  Filled 2015-11-07 (×5): qty 10

## 2015-11-07 NOTE — Progress Notes (Signed)
Pt reported urge to void but was unable to void on the bedpan. Bladder scan indicated 415 cc urine. Pt attempted to void again but was unsuccessful. Peri care completed then sterile straight cath performed x 1 per order. Returned 400 cc clear yellow urine. Will continue to monitor for pt's ability to void.

## 2015-11-07 NOTE — Progress Notes (Signed)
PT Cancellation Note  Patient Details Name: Marie [name] MRN: [id number] DOB: 11/30/1990   Cancelled Treatment:    Reason Eval/Treat Not Completed: Other (comment).  Awaiting delivery of TLSO prior to PT eval.  Will continue to follow.     Eyana Stolze, Alison MurrayMegan F 11/07/2015, 9:50 AM

## 2015-11-07 NOTE — Progress Notes (Addendum)
Pt complaining of pain when trying to urinate. Pt encouraged to use bedpan. Bladder scan done. Pt has 639mL urine in bladder. Unable to void successfully. Peri care completed then sterile straight cath performed per order. Brynda PeonAlma Diaz, [place], [place], RN at bedside assisting. Returned 625mL clear yellow urine. Will continue to monitor.

## 2015-11-07 NOTE — Progress Notes (Signed)
Patient complains of pain in left ankle. Upon assessment ankle is swollen and painful when repositioned. Patient has had absent plantar/dorsiflexion of left foot. No xrays have been completed of left ankle. Passed this information along to morning RN to let MD assess during rounds. PCA encouraged.
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
## 2015-11-07 NOTE — Progress Notes (Signed)
Inpatient Rehabilitation  Patient was screened by Benjiman Sedgwick for appropriateness for an Inpatient Acute Rehab consult.  At this time we are recommending an Inpatient Rehab consult.  Please order if you are agreeable.    Aerielle Stoklosa, M.A., CCC/SLP Admission Coordinator  [facility]  Cell [phone]  

## 2015-11-07 NOTE — Progress Notes (Signed)
[facility] Tech Progress Note Patient Details:  Marie [name] 08/11/1991 [id number]  Patient ID: Marie [name], female   DOB: 04/26/1991, 25 y.o.   MRN: [id number]   [name] 11/07/2015, 7:55 AMCalled [employer] for TLSO.
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
## 2015-11-07 NOTE — Progress Notes (Signed)
Patient ID: Marie [name], female   DOB: 09/29/1991, 25 y.o.   MRN: [id number] BP 113/56 mmHg  Pulse 95  Temp(Src) 98.2 F (36.8 C) (Oral)  Resp 29  Ht 5\' 6"  (1.676 m)  Wt 97.8 kg (215 lb 9.8 oz)  BMI 34.82 kg/m2  SpO2 96%  LMP 11/01/2015 (Approximate) Alert and oriented x 4, speech is clear and fluent Has antigravity hip flexors, certainly 4-/5 adductors Has 2/5 dorsiflexion right foot( is moving some toes), 0/5 left dorsiflexors Wound is clean and dry Brace is in the room. Has been up today Doing well.
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
## 2015-11-08 DIAGNOSIS — G8220 Paraplegia, unspecified: Secondary | ICD-10-CM

## 2015-11-08 DIAGNOSIS — S22081S Stable burst fracture of T11-T12 vertebra, sequela: Secondary | ICD-10-CM

## 2015-11-08 HISTORY — DX: Paraplegia, unspecified: G82.20

## 2015-11-08 LAB — URINALYSIS, ROUTINE W REFLEX MICROSCOPIC
Bilirubin Urine: NEGATIVE
GLUCOSE, UA: NEGATIVE mg/dL
Hgb urine dipstick: NEGATIVE
Ketones, ur: NEGATIVE mg/dL
LEUKOCYTES UA: NEGATIVE
Nitrite: NEGATIVE
PH: 5.5 (ref 5.0–8.0)
PROTEIN: NEGATIVE mg/dL
Specific Gravity, Urine: 1.009 (ref 1.005–1.030)

## 2015-11-08 LAB — CBC
HCT: 32.1 % — ABNORMAL LOW (ref 36.0–46.0)
HEMOGLOBIN: 10.2 g/dL — AB (ref 12.0–15.0)
MCH: 27.9 pg (ref 26.0–34.0)
MCHC: 31.8 g/dL (ref 30.0–36.0)
MCV: 87.9 fL (ref 78.0–100.0)
PLATELETS: 214 10*3/uL (ref 150–400)
RBC: 3.65 MIL/uL — AB (ref 3.87–5.11)
RDW: 14.3 % (ref 11.5–15.5)
WBC: 10.4 10*3/uL (ref 4.0–10.5)

## 2015-11-08 LAB — BASIC METABOLIC PANEL
ANION GAP: 6 (ref 5–15)
BUN: <5 mg/dL — ABNORMAL LOW (ref 6–20)
CHLORIDE: 105 mmol/L (ref 101–111)
CO2: 25 mmol/L (ref 22–32)
Calcium: 8.4 mg/dL — ABNORMAL LOW (ref 8.9–10.3)
Creatinine, Ser: 0.5 mg/dL (ref 0.44–1.00)
Glucose, Bld: 98 mg/dL (ref 65–99)
POTASSIUM: 3.8 mmol/L (ref 3.5–5.1)
SODIUM: 136 mmol/L (ref 135–145)

## 2015-11-08 MED ORDER — KCL IN DEXTROSE-NACL 40-5-0.45 MEQ/L-%-% IV SOLN
INTRAVENOUS | Status: DC
  Administered 2015-11-09 (×2): via INTRAVENOUS
  Filled 2015-11-08 (×3): qty 1000

## 2015-11-08 MED ORDER — BETHANECHOL CHLORIDE 10 MG PO TABS
10.0000 mg | ORAL_TABLET | Freq: Three times a day (TID) | ORAL | Status: DC
  Administered 2015-11-08 – 2015-11-12 (×13): 10 mg via ORAL
  Filled 2015-11-08 (×13): qty 1

## 2015-11-08 NOTE — Progress Notes (Signed)
I will follow up with pt and family Monday to assist in planning venue options and seek insurance approval for a possible inpt rehab admission. [phone]

## 2015-11-08 NOTE — Progress Notes (Signed)
Patient complaining of acute bladder pain.  Dr Lindie SpruceWyatt was brought to bedside to assess.  Patient had been In & Out catheterized 4 times in last 24 hours.  Dr Lindie SpruceWyatt said to insert foley catheter at this time.  Marie [name] 11/08/2015
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
## 2015-11-08 NOTE — Progress Notes (Addendum)
Pt voided 6 hrs ago. Feels no urge to void but does feel pressure on bladder. Gave pt cup of coffee to try and help stimulate bladder. Pt also encouraged to use bedpan. Bladder scan 595mL. Pt unable to void. Sterile intermittent straight cath performed with 350mL urine return. [place]azil[place]Jordan Allen, RN, Juleen Starraryn Hutton RN at bedside assisting. Follow up bladder scan completed, 255mL in pt bladder after straight cath. Will continue to monitor. Will pass along to dayshift RN
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
## 2015-11-08 NOTE — Progress Notes (Signed)
Patient ID: Marie [name], female   DOB: 12/10/1990, 25 y.o.   MRN: [id number] BP 104/54 mmHg  Pulse 90  Temp(Src) 98.8 F (37.1 C) (Oral)  Resp 20  Ht 5\' 5"  (1.651 m)  Wt 115.3 kg (254 lb 3.1 oz)  BMI 42.30 kg/m2  SpO2 97%  LMP 11/01/2015 (Approximate) Alert and oriented x4 Slightly more movement in right foot, otherwise no real change Will be transferred to rehab Wound is clean, no signs of infection
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
## 2015-11-09 ENCOUNTER — Inpatient Hospital Stay (HOSPITAL_COMMUNITY): Payer: Worker's Compensation

## 2015-11-09 MED ORDER — BISACODYL 10 MG RE SUPP
10.0000 mg | Freq: Once | RECTAL | Status: AC
  Administered 2015-11-09: 10 mg via RECTAL
  Filled 2015-11-09: qty 1

## 2015-11-09 MED ORDER — BISACODYL 10 MG RE SUPP: 10.0000 mg | Freq: Once | RECTAL | Status: DC

## 2015-11-09 MED ORDER — BISACODYL 10 MG RE SUPP: 10.0000 mg | Freq: Every day | RECTAL | Status: DC | PRN
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
## 2015-11-10 MED ORDER — MORPHINE SULFATE (PF) 2 MG/ML IV SOLN
1.0000 mg | INTRAVENOUS | Status: DC | PRN
  Administered 2015-11-10 (×2): 4 mg via INTRAVENOUS
  Administered 2015-11-10 – 2015-11-11 (×2): 2 mg via INTRAVENOUS
  Administered 2015-11-12: 4 mg via INTRAVENOUS
  Filled 2015-11-10 (×3): qty 2
  Filled 2015-11-10 (×2): qty 1

## 2015-11-10 MED ORDER — OXYCODONE HCL 5 MG PO TABS
5.0000 mg | ORAL_TABLET | ORAL | Status: DC | PRN
  Administered 2015-11-10: 10 mg via ORAL
  Administered 2015-11-10: 5 mg via ORAL
  Administered 2015-11-11 – 2015-11-12 (×6): 10 mg via ORAL
  Administered 2015-11-12: 5 mg via ORAL
  Administered 2015-11-12: 10 mg via ORAL
  Filled 2015-11-10: qty 1
  Filled 2015-11-10 (×2): qty 2
  Filled 2015-11-10: qty 1
  Filled 2015-11-10 (×6): qty 2
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
## 2015-11-11 LAB — BASIC METABOLIC PANEL
ANION GAP: 10 (ref 5–15)
BUN: 10 mg/dL (ref 6–20)
CALCIUM: 9.3 mg/dL (ref 8.9–10.3)
CO2: 27 mmol/L (ref 22–32)
Chloride: 99 mmol/L — ABNORMAL LOW (ref 101–111)
Creatinine, Ser: 0.61 mg/dL (ref 0.44–1.00)
GFR calc Af Amer: >60 mL/min (ref >60)
Glucose, Bld: 99 mg/dL (ref 65–99)
POTASSIUM: 4.1 mmol/L (ref 3.5–5.1)
SODIUM: 136 mmol/L (ref 135–145)

## 2015-11-11 LAB — CBC
HCT: 38.5 % (ref 36.0–46.0)
HEMOGLOBIN: 12.7 g/dL (ref 12.0–15.0)
MCH: 28.3 pg (ref 26.0–34.0)
MCHC: 33 g/dL (ref 30.0–36.0)
MCV: 85.9 fL (ref 78.0–100.0)
Platelets: 313 10*3/uL (ref 150–400)
RBC: 4.48 MIL/uL (ref 3.87–5.11)
RDW: 13.7 % (ref 11.5–15.5)
WBC: 10.3 10*3/uL (ref 4.0–10.5)

## 2015-11-11 MED ORDER — ENOXAPARIN SODIUM 40 MG/0.4ML ~~LOC~~ SOLN
40.0000 mg | SUBCUTANEOUS | Status: DC
  Administered 2015-11-11: 40 mg via SUBCUTANEOUS
  Filled 2015-11-11: qty 0.4
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
## 2015-11-11 NOTE — Progress Notes (Signed)
I met with pt at bedside to begin discussions for an inpt rehab admission pending insurance approval when pt medically ready. Pt plans to d/c to her fiance's home with his Mom and family. It is wheelchair accessible. I have asked pt to have fiance and his Mom to contact me to discuss rehab venue. [phone]
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
## 2015-11-12 ENCOUNTER — Encounter (HOSPITAL_COMMUNITY): Payer: Self-pay | Admitting: *Deleted

## 2015-11-12 ENCOUNTER — Inpatient Hospital Stay (HOSPITAL_COMMUNITY)
Admission: AD | Admit: 2015-11-12 | Discharge: 2015-12-03 | DRG: 092 | Disposition: A | Discharge status: Home Health | Payer: Worker's Compensation | Source: Intra-hospital | Attending: Physical Medicine & Rehabilitation | Admitting: Physical Medicine & Rehabilitation

## 2015-11-12 ENCOUNTER — Inpatient Hospital Stay (HOSPITAL_COMMUNITY): Payer: Worker's Compensation

## 2015-11-12 DIAGNOSIS — N390 Urinary tract infection, site not specified: Secondary | ICD-10-CM | POA: Diagnosis not present

## 2015-11-12 DIAGNOSIS — S32009A Unspecified fracture of unspecified lumbar vertebra, initial encounter for closed fracture: Secondary | ICD-10-CM | POA: Diagnosis present

## 2015-11-12 DIAGNOSIS — B9620 Unspecified Escherichia coli [E. coli] as the cause of diseases classified elsewhere: Secondary | ICD-10-CM | POA: Diagnosis not present

## 2015-11-12 DIAGNOSIS — G8220 Paraplegia, unspecified: Secondary | ICD-10-CM | POA: Diagnosis present

## 2015-11-12 DIAGNOSIS — M549 Dorsalgia, unspecified: Secondary | ICD-10-CM | POA: Diagnosis present

## 2015-11-12 DIAGNOSIS — G4700 Insomnia, unspecified: Secondary | ICD-10-CM | POA: Diagnosis present

## 2015-11-12 DIAGNOSIS — F17210 Nicotine dependence, cigarettes, uncomplicated: Secondary | ICD-10-CM | POA: Diagnosis present

## 2015-11-12 DIAGNOSIS — Z981 Arthrodesis status: Secondary | ICD-10-CM

## 2015-11-12 DIAGNOSIS — G959 Disease of spinal cord, unspecified: Secondary | ICD-10-CM | POA: Diagnosis present

## 2015-11-12 DIAGNOSIS — K592 Neurogenic bowel, not elsewhere classified: Secondary | ICD-10-CM | POA: Diagnosis present

## 2015-11-12 DIAGNOSIS — N319 Neuromuscular dysfunction of bladder, unspecified: Secondary | ICD-10-CM | POA: Insufficient documentation

## 2015-11-12 DIAGNOSIS — G834 Cauda equina syndrome: Secondary | ICD-10-CM | POA: Diagnosis not present

## 2015-11-12 DIAGNOSIS — S30811A Abrasion of abdominal wall, initial encounter: Secondary | ICD-10-CM | POA: Diagnosis present

## 2015-11-12 DIAGNOSIS — S22081D Stable burst fracture of T11-T12 vertebra, subsequent encounter for fracture with routine healing: Secondary | ICD-10-CM | POA: Diagnosis not present

## 2015-11-12 DIAGNOSIS — F4321 Adjustment disorder with depressed mood: Secondary | ICD-10-CM | POA: Diagnosis present

## 2015-11-12 DIAGNOSIS — R609 Edema, unspecified: Secondary | ICD-10-CM

## 2015-11-12 DIAGNOSIS — G9581 Conus medullaris syndrome: Secondary | ICD-10-CM | POA: Diagnosis present

## 2015-11-12 DIAGNOSIS — S060X9A Concussion with loss of consciousness of unspecified duration, initial encounter: Secondary | ICD-10-CM | POA: Diagnosis present

## 2015-11-12 DIAGNOSIS — G8310 Monoplegia of lower limb affecting unspecified side: Secondary | ICD-10-CM

## 2015-11-12 DIAGNOSIS — S22081S Stable burst fracture of T11-T12 vertebra, sequela: Secondary | ICD-10-CM

## 2015-11-12 DIAGNOSIS — Z6841 Body Mass Index (BMI) 40.0 and over, adult: Secondary | ICD-10-CM

## 2015-11-12 DIAGNOSIS — S22081A Stable burst fracture of T11-T12 vertebra, initial encounter for closed fracture: Secondary | ICD-10-CM | POA: Diagnosis present

## 2015-11-12 DIAGNOSIS — S01402A Unspecified open wound of left cheek and temporomandibular area, initial encounter: Secondary | ICD-10-CM | POA: Diagnosis present

## 2015-11-12 LAB — COMPREHENSIVE METABOLIC PANEL
ALBUMIN: 3.1 g/dL — AB (ref 3.5–5.0)
ALT: 27 U/L (ref 14–54)
ANION GAP: 10 (ref 5–15)
AST: 27 U/L (ref 15–41)
Alkaline Phosphatase: 60 U/L (ref 38–126)
BUN: 12 mg/dL (ref 6–20)
CHLORIDE: 100 mmol/L — AB (ref 101–111)
CO2: 27 mmol/L (ref 22–32)
Calcium: 9.2 mg/dL (ref 8.9–10.3)
Creatinine, Ser: 0.62 mg/dL (ref 0.44–1.00)
GFR calc non Af Amer: >60 mL/min (ref >60)
GLUCOSE: 100 mg/dL — AB (ref 65–99)
Potassium: 4.2 mmol/L (ref 3.5–5.1)
SODIUM: 137 mmol/L (ref 135–145)
Total Bilirubin: 0.7 mg/dL (ref 0.3–1.2)
Total Protein: 6.9 g/dL (ref 6.5–8.1)

## 2015-11-12 MED ORDER — ACETAMINOPHEN 650 MG RE SUPP: 650.0000 mg | RECTAL | Status: DC | PRN

## 2015-11-12 MED ORDER — PANTOPRAZOLE SODIUM 40 MG PO TBEC
40.0000 mg | DELAYED_RELEASE_TABLET | Freq: Every day | ORAL | Status: DC
  Administered 2015-11-13 – 2015-12-01 (×19): 40 mg via ORAL
  Filled 2015-11-12 (×19): qty 1

## 2015-11-12 MED ORDER — METHOCARBAMOL 500 MG PO TABS
500.0000 mg | ORAL_TABLET | Freq: Four times a day (QID) | ORAL | Status: DC | PRN
  Administered 2015-11-12 – 2015-11-14 (×6): 500 mg via ORAL
  Filled 2015-11-12 (×6): qty 1

## 2015-11-12 MED ORDER — POLYETHYLENE GLYCOL 3350 17 G PO PACK
17.0000 g | PACK | Freq: Every day | ORAL | Status: DC
  Administered 2015-11-12 – 2015-12-03 (×21): 17 g via ORAL
  Filled 2015-11-12 (×21): qty 1

## 2015-11-12 MED ORDER — BETHANECHOL CHLORIDE 10 MG PO TABS
10.0000 mg | ORAL_TABLET | Freq: Three times a day (TID) | ORAL | Status: DC
  Administered 2015-11-12 – 2015-11-13 (×2): 10 mg via ORAL
  Filled 2015-11-12 (×2): qty 1

## 2015-11-12 MED ORDER — ONDANSETRON HCL 4 MG/2ML IJ SOLN: 4.0000 mg | Freq: Four times a day (QID) | INTRAMUSCULAR | Status: DC | PRN

## 2015-11-12 MED ORDER — ACETAMINOPHEN 325 MG PO TABS: 325.0000 mg | ORAL_TABLET | ORAL | Status: DC | PRN

## 2015-11-12 MED ORDER — BISACODYL 5 MG PO TBEC: 5.0000 mg | DELAYED_RELEASE_TABLET | Freq: Every day | ORAL | Status: DC | PRN

## 2015-11-12 MED ORDER — TAMSULOSIN HCL 0.4 MG PO CAPS
0.4000 mg | ORAL_CAPSULE | Freq: Every day | ORAL | Status: DC
  Administered 2015-11-12: 0.4 mg via ORAL
  Filled 2015-11-12: qty 1

## 2015-11-12 MED ORDER — SENNA 8.6 MG PO TABS
1.0000 | ORAL_TABLET | Freq: Two times a day (BID) | ORAL | Status: DC
  Administered 2015-11-12 – 2015-12-03 (×41): 8.6 mg via ORAL
  Filled 2015-11-12 (×41): qty 1

## 2015-11-12 MED ORDER — ONDANSETRON HCL 4 MG PO TABS: 4.0000 mg | ORAL_TABLET | Freq: Four times a day (QID) | ORAL | Status: DC | PRN

## 2015-11-12 MED ORDER — TAMSULOSIN HCL 0.4 MG PO CAPS
0.4000 mg | ORAL_CAPSULE | Freq: Every day | ORAL | Status: DC
  Administered 2015-11-13 – 2015-11-27 (×15): 0.4 mg via ORAL
  Filled 2015-11-12 (×15): qty 1

## 2015-11-12 MED ORDER — PANTOPRAZOLE SODIUM 40 MG IV SOLR: 40.0000 mg | Freq: Every day | INTRAVENOUS | Status: DC

## 2015-11-12 MED ORDER — OXYCODONE HCL ER 10 MG PO T12A
10.0000 mg | EXTENDED_RELEASE_TABLET | Freq: Two times a day (BID) | ORAL | Status: DC
  Administered 2015-11-12 – 2015-12-01 (×39): 10 mg via ORAL
  Filled 2015-11-12 (×39): qty 1

## 2015-11-12 MED ORDER — DIAZEPAM 5 MG PO TABS
5.0000 mg | ORAL_TABLET | Freq: Two times a day (BID) | ORAL | Status: DC | PRN
  Administered 2015-11-13: 5 mg via ORAL
  Filled 2015-11-12: qty 1

## 2015-11-12 MED ORDER — SORBITOL 70 % SOLN
30.0000 mL | Freq: Every day | Status: DC | PRN
  Administered 2015-11-13: 30 mL via ORAL
  Filled 2015-11-12: qty 30

## 2015-11-12 MED ORDER — ACETAMINOPHEN 325 MG PO TABS: 650.0000 mg | ORAL_TABLET | ORAL | Status: DC | PRN

## 2015-11-12 MED ORDER — MORPHINE SULFATE (PF) 2 MG/ML IV SOLN
1.0000 mg | Freq: Four times a day (QID) | INTRAVENOUS | Status: DC | PRN
  Administered 2015-11-12: 2 mg via INTRAVENOUS
  Filled 2015-11-12: qty 1

## 2015-11-12 MED ORDER — ENOXAPARIN SODIUM 40 MG/0.4ML ~~LOC~~ SOLN
40.0000 mg | SUBCUTANEOUS | Status: DC
  Administered 2015-11-12 – 2015-12-02 (×21): 40 mg via SUBCUTANEOUS
  Filled 2015-11-12 (×21): qty 0.4

## 2015-11-12 MED ORDER — OXYCODONE HCL 5 MG PO TABS
5.0000 mg | ORAL_TABLET | ORAL | Status: DC | PRN
  Administered 2015-11-12 (×2): 10 mg via ORAL
  Administered 2015-11-13: 5 mg via ORAL
  Administered 2015-11-13 (×4): 10 mg via ORAL
  Administered 2015-11-14: 5 mg via ORAL
  Administered 2015-11-14 – 2015-11-16 (×8): 10 mg via ORAL
  Administered 2015-11-16 (×3): 5 mg via ORAL
  Administered 2015-11-17 – 2015-12-03 (×33): 10 mg via ORAL
  Filled 2015-11-12: qty 2
  Filled 2015-11-12: qty 1
  Filled 2015-11-12 (×5): qty 2
  Filled 2015-11-12: qty 1
  Filled 2015-11-12 (×47): qty 2
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
## 2015-11-12 NOTE — Progress Notes (Signed)
I await insurance approval to hopefully admit pt to inpt rehab today. Pt is aware and in agreement. I will follow up today. [phone]
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
## 2015-11-12 NOTE — Progress Notes (Signed)
*  Preliminary Results* Bilateral lower extremity venous duplex completed. Visualized veins of bilateral lower extremities are negative for deep vein thrombosis. There is no evidence of Baker's cyst bilaterally.  11/12/2015  Gertie FeyMichelle Deundre Thong, RVT, RDCS, RDMS

## 2015-11-12 NOTE — Progress Notes (Signed)
Pt unable to empty bladder. Pt bladder scanned showing 530 ml. In and out cath done with relief to pt. Pt states her back does not hurt anymore and she feels better. Will continue to monitor.
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
## 2015-11-12 NOTE — Progress Notes (Signed)
Pt to d/c to CIR, VSS, called report. Called family to notify of TX.
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
## 2015-11-13 ENCOUNTER — Inpatient Hospital Stay (HOSPITAL_COMMUNITY): Payer: PRIVATE HEALTH INSURANCE | Admitting: Physical Therapy

## 2015-11-13 ENCOUNTER — Inpatient Hospital Stay (HOSPITAL_COMMUNITY): Payer: Worker's Compensation | Admitting: *Deleted

## 2015-11-13 ENCOUNTER — Inpatient Hospital Stay (HOSPITAL_COMMUNITY): Payer: Worker's Compensation | Admitting: Occupational Therapy

## 2015-11-13 DIAGNOSIS — S343XXD Injury of cauda equina, subsequent encounter: Secondary | ICD-10-CM

## 2015-11-13 LAB — URINALYSIS, ROUTINE W REFLEX MICROSCOPIC
BILIRUBIN URINE: NEGATIVE
GLUCOSE, UA: NEGATIVE mg/dL
KETONES UR: NEGATIVE mg/dL
LEUKOCYTES UA: NEGATIVE
Nitrite: NEGATIVE
PH: 5 (ref 5.0–8.0)
PROTEIN: NEGATIVE mg/dL
SPECIFIC GRAVITY, URINE: 1.033 — AB (ref 1.005–1.030)

## 2015-11-13 LAB — URINE MICROSCOPIC-ADD ON

## 2015-11-13 MED ORDER — BETHANECHOL CHLORIDE 25 MG PO TABS
25.0000 mg | ORAL_TABLET | Freq: Three times a day (TID) | ORAL | Status: DC
  Administered 2015-11-13 – 2015-11-23 (×30): 25 mg via ORAL
  Filled 2015-11-13 (×30): qty 1

## 2015-11-13 MED ORDER — BISACODYL 10 MG RE SUPP
10.0000 mg | Freq: Every day | RECTAL | Status: DC | PRN
  Administered 2015-11-13 – 2015-11-15 (×2): 10 mg via RECTAL
  Filled 2015-11-13 (×2): qty 1

## 2015-11-13 NOTE — Care Management Note (Signed)
Inpatient Rehabilitation Center Individual Statement of Services  Patient Name:  Marie [name]  Date:  11/13/2015  Welcome to the Inpatient Rehabilitation Center.  Our goal is to provide you with an individualized program based on your diagnosis and situation, designed to meet your specific needs.  With this comprehensive rehabilitation program, you will be expected to participate in at least 3 hours of rehabilitation therapies Monday-Friday, with modified therapy programming on the weekends.  Your rehabilitation program will include the following services:  Physical Therapy (PT), Occupational Therapy (OT), 24 hour per day rehabilitation nursing, Therapeutic Recreaction (TR), Neuropsychology, Case Management (Social Worker), Rehabilitation Medicine, Nutrition Services and Pharmacy Services  Weekly team conferences will be held on Wednesday to discuss your progress.  Your Social Worker will talk with you frequently to get your input and to update you on team discussions.  Team conferences with you and your family in attendance may also be held.  Expected length of stay: 21-23 days  Overall anticipated outcome: min assist wheelchair level  Depending on your progress and recovery, your program may change. Your Social Worker will coordinate services and will keep you informed of any changes. Your Social Worker's name and contact numbers are listed  below.  The following services may also be recommended but are not provided by the Inpatient Rehabilitation Center:   Driving Evaluations  Home Health Rehabiltiation Services  Outpatient Rehabilitation Services  Vocational Rehabilitation   Arrangements will be made to provide these services after discharge if needed.  Arrangements include referral to agencies that provide these services.  Your insurance has been verified to be:  [employer] Your primary doctor is:  Junious DresserStephen Campbell  Pertinent information will be shared with your doctor and your  insurance company.  Social Worker:  Dossie DerBecky Kenya Shiraishi, SW [phone] or ([phone]) [phone]  Information discussed with and copy given to patient by: Lucy Chrisupree, Bretton Tandy G, 11/13/2015, 3:14 PM
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
## 2015-11-13 NOTE — Plan of Care (Signed)
Problem: SCI BLADDER ELIMINATION Goal: RH STG MANAGE BLADDER WITH MEDICATION WITH ASSISTANCE STG Manage Bladder With Medication With max Assistance.  Outcome: Not Progressing Patient I/O cath x 3. Patient with urinary retention, pain and discomfort. adm  Problem: RH PAIN MANAGEMENT Goal: RH STG PAIN MANAGED AT OR BELOW PT'S PAIN GOAL Pain less than 4  Outcome: Not Progressing Patient rate pain 5 - 8 for mid back pain.
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
## 2015-11-13 NOTE — Progress Notes (Signed)
Physical Therapy Session Note  Patient Details  Name: Marie [name] MRN: [id number] Date of Birth: 07/16/1991  Today's Date: 11/13/2015 PT Individual Time: [id number] PT Individual Time Calculation (min): 63 min   Short Term Goals: Week 1:  PT Short Term Goal 1 (Week 1): Pt will perform supine >sit minA PT Short Term Goal 2 (Week 1): Pt will perform sit >supine modA PT Short Term Goal 3 (Week 1): Pt will perform lateral scoot transfer w/c <>bed max A +1 PT Short Term Goal 4 (Week 1): Pt will perform static standing x2 min with LRAD   Skilled Therapeutic Interventions/Progress Updates:   Pt received in bed after OT session; pt reporting having another BM.  Pt engaged in rolling to L and R with TLSO donned and use of bed rails with mod A overall for LE flexion to perform clothing management and hygiene and supine > sit with TLSO with max A to bring LE off of bed and to bring trunk upright to sitting.  Elevated bed and pt performed one sit > stand from elevated bed with bilat UE support on RW and +2 total A and pt able to stand 4-5 seconds with assistance at bilat LE to maintain extension.  Transitioned to UE support on Stedy and performed sit <> stand with +2 total A to transfer bed <> light weight Quickie w/c, changed from K4 manual w/c for increased trunk/back support, lightweight frame for more efficient/independet propulsion, and tubular arm rests for push ups for pressure relief.  Performed w/c mobility x 150' over tile and carpet with supervision.  Family educated on w/c boosting technique and technique demonstrated to family.  Pt also provided with orange theraband and reviewed exercises with pt for her to perform in her room: hip ABD, hip ADD pillow squeezes, hamstring curls, LAQ with pt return demonstrating all exercises.  Returned to room and performed slideboard transfer w/c > bed with max A with therapist providing cues/facilitation for anterior/lateral leans and use of head-hips  relationship to advance across board.  Performed sit> supine with max A and rolling to remove TLSO.  Pt propped in sidelying with pillows for pressure relief and left with family present and all items within reach.   Therapy Documentation Precautions:  Precautions Precautions: Back, Fall Precaution Booklet Issued: No Precaution Comments: instructed in back precautions Required Braces or Orthoses: Spinal Brace Spinal Brace: Thoracolumbosacral orthotic, Applied in sitting position Restrictions Weight Bearing Restrictions: No Vital Signs: Therapy Vitals Temp: 98 F (36.7 C) Temp Source: Oral Pain: Pain Assessment Pain Score: 6  Pain Type: Acute pain Pain Location: Back Pain Orientation: Lower Pain Descriptors / Indicators: Sore Pain Onset: On-going Pain Intervention(s): Other (Comment) (premedicated)   See Function Navigator for Current Functional Status.   Therapy/Group: Individual Therapy  Edman CircleHall, [name] 11/13/2015, 8:33 PM

## 2015-11-13 NOTE — Progress Notes (Signed)
Occupational Therapy Session Note  Patient Details  Name: Marie [name] MRN: [id number] Date of Birth: 06/19/1991  Today's Date: 11/13/2015 OT Individual Time: 1402-1500 OT Individual Time Calculation (min): 58 min    Short Term Goals: Week 1:  OT Short Term Goal 1 (Week 1): Pt will transfer to wide drop arm BSC with max A x1. OT Short Term Goal 2 (Week 1): Pt will be able to roll in bed with min A to assist caregivers with LB self care. OT Short Term Goal 3 (Week 1): Pt will be able to use reacher to don pants over feet with min A. OT Short Term Goal 4 (Week 1): Pt will be able to bridge hips with min A in bed to pull pants over hips.  Skilled Therapeutic Interventions/Progress Updates:   Pt worked on bed mobility, toilet transfers, and toileting during session.  Mod assist with mod instructional cueing to sequence rolling to the side, with max assist to transition to sitting from sidelying position.  Pt needed total assist for donning TLSO in sitting with min guard assist needed for balance with BUE support.  Total assist +2 (pt 30%) for sliding board transfers as pt currently with not clothing during start of session except for hospital gown.  Total assist needed for toilet hygiene in sitting as she could not adequately reach with either hand.  Worked on donning brief in sitting prior to transfer back across the sliding board to the bed.  Total assist +2 (pt 10%) to donn incontinence brief with lateral leans side to side.  Unable to donn completely so pt returned back to the bed and to supine to complete.  During this time period pt's mother-in-law came and brought clothing.  Total assist for donning sweat pants supine in bed rolling.  Pt left in bed at end of session to rest prior to PT session beginning.    Therapy Documentation Precautions:  Precautions Precautions: Back, Fall Precaution Booklet Issued: No Precaution Comments: instructed in back precautions Required Braces or  Orthoses: Spinal Brace Spinal Brace: Thoracolumbosacral orthotic, Applied in sitting position Restrictions Weight Bearing Restrictions: No  Vital Signs: Therapy Vitals Pulse Rate: (!) 130 Resp: 18 BP: (!) 142/74 mmHg Patient Position (if appropriate): Sitting Oxygen Therapy SpO2: 99 % O2 Device: Not Delivered Pain: Pain Assessment Pain Assessment: No/denies pain Pain Score: 0-No pain Pain Type: Acute pain Pain Location: Back Pain Orientation: Lower Pain Intervention(s): Medication (See eMAR) ADL: See Function Navigator for Current Functional Status.   Therapy/Group: Individual Therapy  Zlaty Alexa OTR/L 11/13/2015, 4:28 PM

## 2015-11-13 NOTE — Progress Notes (Signed)
Patient information reviewed and entered into eRehab system by Nil Bolser, RN, CRRN, PPS Coordinator.  Information including medical coding and functional independence measure will be reviewed and updated through discharge.    
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
## 2015-11-14 ENCOUNTER — Inpatient Hospital Stay (HOSPITAL_COMMUNITY): Payer: Worker's Compensation | Admitting: Occupational Therapy

## 2015-11-14 ENCOUNTER — Inpatient Hospital Stay (HOSPITAL_COMMUNITY): Payer: Worker's Compensation | Admitting: Physical Therapy

## 2015-11-14 ENCOUNTER — Inpatient Hospital Stay (HOSPITAL_COMMUNITY): Payer: Worker's Compensation

## 2015-11-14 ENCOUNTER — Encounter (HOSPITAL_COMMUNITY): Payer: Self-pay | Admitting: Neurosurgery

## 2015-11-14 ENCOUNTER — Inpatient Hospital Stay (HOSPITAL_COMMUNITY): Payer: PRIVATE HEALTH INSURANCE | Admitting: Occupational Therapy

## 2015-11-14 LAB — URINE CULTURE: Culture: 5000

## 2015-11-14 MED ORDER — NICOTINE 7 MG/24HR TD PT24
7.0000 mg | MEDICATED_PATCH | Freq: Every day | TRANSDERMAL | Status: DC
  Administered 2015-11-14 – 2015-12-03 (×20): 7 mg via TRANSDERMAL
  Filled 2015-11-14 (×24): qty 1

## 2015-11-14 MED ORDER — TRAZODONE HCL 50 MG PO TABS
50.0000 mg | ORAL_TABLET | Freq: Every evening | ORAL | Status: DC | PRN
  Administered 2015-11-14 – 2015-12-02 (×19): 50 mg via ORAL
  Filled 2015-11-14 (×19): qty 1

## 2015-11-14 MED ORDER — BACLOFEN 10 MG PO TABS
10.0000 mg | ORAL_TABLET | Freq: Three times a day (TID) | ORAL | Status: DC | PRN
  Administered 2015-11-14 – 2015-11-20 (×5): 10 mg via ORAL
  Filled 2015-11-14 (×5): qty 1

## 2015-11-14 NOTE — Plan of Care (Signed)
Problem: RH PAIN MANAGEMENT Goal: RH STG PAIN MANAGED AT OR BELOW PT'S PAIN GOAL Pain less than 4  Outcome: Not Progressing Pt rates pain 9/10

## 2015-11-14 NOTE — Plan of Care (Signed)
Problem: RH PAIN MANAGEMENT Goal: RH STG PAIN MANAGED AT OR BELOW PT'S PAIN GOAL Pain less than 4  Outcome: Not Progressing Consistently rates pain 7-9/10

## 2015-11-14 NOTE — Progress Notes (Signed)
Occupational Therapy Session Note  Patient Details  Name: Marie [name] MRN: [id number] Date of Birth: 07/08/1991  Today's Date: 11/14/2015 OT Individual Time: 1300-1330 OT Individual Time Calculation (min): 30 min    Short Term Goals: Week 1:  OT Short Term Goal 1 (Week 1): Pt will transfer to wide drop arm BSC with max A x1. OT Short Term Goal 2 (Week 1): Pt will be able to roll in bed with min A to assist caregivers with LB self care. OT Short Term Goal 3 (Week 1): Pt will be able to use reacher to don pants over feet with min A. OT Short Term Goal 4 (Week 1): Pt will be able to bridge hips with min A in bed to pull pants over hips.  Skilled Therapeutic Interventions/Progress Updates:    1:1 focus on bed mobility from flat bed and no bed rail supine to sit with mod A for management of both LEs.  Continued focus on sitting balance with and without UE support and weight shifting away from BOS while  maintaining balance and return to midline. Required extra time. Continued education about importance of proper slide board placement and how to guide caregivers. Pt transferred from bed to w/c via slide board with more than reasonable amt of time but with mod A!!!. Pt able to lift feet to place them on foot rests against gravity.  Pt self propelled w/c to gym for strengthening with supervision. Also discussed toileting and toilet transfer goals and instructions on care for TLSO pads. Performed 10 w/c push ups and recipical scooting in w/c with VC and tactile input through LEs to remain on the floor. Return to room and setup for lunch.   Therapy Documentation Precautions:  Precautions Precautions: Back, Fall Precaution Booklet Issued: No Precaution Comments: instructed in back precautions Required Braces or Orthoses: Spinal Brace Spinal Brace: Thoracolumbosacral orthotic, Applied in sitting position Restrictions Weight Bearing Restrictions: No Pain: Pain Assessment Pain Assessment:  0-10 Pain Score: 3  Pain Type: Acute pain Pain Location: Back Pain Orientation: Lower Pain Descriptors / Indicators: Throbbing Pain Frequency: Constant Pain Onset: On-going Pain Intervention(s): Medication (See eMAR) ADL: ADL ADL Comments: refer to functional navigator  See Function Navigator for Current Functional Status.   Therapy/Group: Individual Therapy  Roney MansSmith, Jacayla Nordell [facility] 11/14/2015, 1:50 PM

## 2015-11-14 NOTE — Progress Notes (Signed)
Physical Therapy Session Note  Patient Details  Name: Marie [name] MRN: [id number] Date of Birth: 07/17/1991  Today's Date: 11/14/2015 PT Individual Time: 0930-1030 PT Individual Time Calculation (min): 60 min   Short Term Goals: Week 1:  PT Short Term Goal 1 (Week 1): Pt will perform supine >sit minA PT Short Term Goal 2 (Week 1): Pt will perform sit >supine modA PT Short Term Goal 3 (Week 1): Pt will perform lateral scoot transfer w/c <>bed max A +1 PT Short Term Goal 4 (Week 1): Pt will perform static standing x2 min with LRAD   Skilled Therapeutic Interventions/Progress Updates:    Pt received seated in w/c with no c/o pain and agreeable to treatment. W/c propulsion to gym x175' with S. Transfer w/c >mat table level height maxA using transfer board. Requires totalA for lifting LE to place transfer board with pt performing R lateral lean onto arm rest. From elevated mat table, sit <>stand x4 trials. Initially performed with RUE on table for safety, however pt unable to complete stand due to asymmetrical weight bearing and LE strength deficits. Allowed pt to perform third and fourth trial with BUEs on RW and improved performance with pt nearly coming to full standing position. When cued to perform glute set in standing for terminal hip extension, noted quad activation and no glute activation. BP assessed before standing 131/59 HR 130; after standing 143/73 HR 128. After short rest break BP 135/71 HR 122, pt asymptomatic throughout. Sitting balance with 4# weighted bar while hitting beach ball, min guard for sitting balance and one minor LOB anteriorly requiring minA and pt's BUEs to recover. Transfer >w/c maxA +1. 3 sets 5 w/c pushups for postioning and pressure relief. Returned to room totalA for energy conservation. Transfer>bed maxA +1 using slide board. Max/totalA for scooting backward after finishing transfer close to edge of bed. Sit >supine maxA. Remained in R sidelying with pillows  positioned for pressure relief, all needs within reach at completion of session.   Therapy Documentation Precautions:  Precautions Precautions: Back, Fall Precaution Booklet Issued: No Precaution Comments: instructed in back precautions Required Braces or Orthoses: Spinal Brace Spinal Brace: Thoracolumbosacral orthotic, Applied in sitting position Restrictions Weight Bearing Restrictions: No Pain: Pain Assessment Pain Assessment: No/denies pain Pain Score: 0-No pain Pain Type: Acute pain Pain Location: Back Pain Orientation: Lower Pain Descriptors / Indicators: Throbbing Pain Frequency: Constant Pain Onset: On-going Patients Stated Pain Goal: 5 Pain Intervention(s): Medication (See eMAR)   See Function Navigator for Current Functional Status.   Therapy/Group: Individual Therapy  Vista Lawmanlizabeth J Tygielski 11/14/2015, 10:30 AM
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
## 2015-11-14 NOTE — Progress Notes (Signed)
Physical Therapy Session Note  Patient Details  Name: Marie [name] MRN: [id number] Date of Birth: 04/14/1991  Today's Date: 11/14/2015 PT Individual Time:1538  - 1620, 42 min    Short Term Goals: Week 1:  PT Short Term Goal 1 (Week 1): Pt will perform supine >sit minA PT Short Term Goal 2 (Week 1): Pt will perform sit >supine modA PT Short Term Goal 3 (Week 1): Pt will perform lateral scoot transfer w/c <>bed max A +1 PT Short Term Goal 4 (Week 1): Pt will perform static standing x2 min with LRAD   Skilled Therapeutic Interventions/Progress Updates:    w/c propulsion for activity tolerance.  neuromuscular re-education via forced use, manual cues, visual feedback for Kinetron in sitting x 70 cm/sec x 25 cycles x 2 , focusing on neutral hip rotation; knee flex/ext to move feet forwards/backwards on foot plates x 2 each.  Therapeutic activity in sitting: kicking beach ball with L/R foot x 15 each, working on controlled eccentric descent of foot q kick. W/c> bed with slide board +2 to return to bed at end of session.  PRAFOs donned, bed alarm set and all needs placed within reach.  Therapy Documentation Precautions:  Precautions Precautions: Back, Fall Precaution Booklet Issued: No Precaution Comments: instructed in back precautions Required Braces or Orthoses: Spinal Brace Spinal Brace: Thoracolumbosacral orthotic, Applied in sitting position Restrictions Weight Bearing Restrictions: No   Pain: Pain Assessment Pain Assessment: No/denies pain Pain Score: 3            See Function Navigator for Current Functional Status.   Therapy/Group: Individual Therapy  Garima Chronis 11/14/2015, 4:09 PM

## 2015-11-14 NOTE — Progress Notes (Signed)
Occupational Therapy Session Note  Patient Details  Name: Cherrie Distancenna G Varkey MRN: [id number] Date of Birth: 05/21/1991  Today's Date: 11/14/2015 OT Individual Time: 0730-0900 and 1350-1420 OT Individual Time Calculation (min): 90 min and 30 min   Short Term Goals: Week 1:  OT Short Term Goal 1 (Week 1): Pt will transfer to wide drop arm BSC with max A x1. OT Short Term Goal 2 (Week 1): Pt will be able to roll in bed with min A to assist caregivers with LB self care. OT Short Term Goal 3 (Week 1): Pt will be able to use reacher to don pants over feet with min A. OT Short Term Goal 4 (Week 1): Pt will be able to bridge hips with min A in bed to pull pants over hips.  Skilled Therapeutic Interventions/Progress Updates:    1) Treatment session with focus on self-care retraining, bed mobility, transfers, and overall activity tolerance. Engaged in LB bathing and dressing at bed level with therapist assisting with positioning of legs to increase participation in LB bathing.  Pt able to assist with knee flexion to position in preparation for log rolling for therapist to wash buttocks.  Donned brief and pants at bed level with rolling Rt and Lt, with pt utilizing bed rails and mod-max assist for positioning.  Mod-max assist sidelying to sitting at EOB with pt demonstrating good UB strength to assist with mobility. Heavy reliance on UE support in unsupported sitting at EOB while therapist donned TLSO. Slide board transfer bed > w/c with +2 for safety max assist for lifting/lowering during transfer.  Pt completed UB bathing with setup assist and donned shirt, requiring assist to pull shirt over back and total assist to don TLSO.  Grooming completed at sink and then set up for breakfast.  2) Treatment session with focus on BUE strengthening to increase participation with sit > stand and functional transfers.  Engaged in 2 sets of 10 each horizontal abduction at chest, horizontal tricep pull down, bicep curls and  tricep extension.  Provided pt with handout for each exercise and encouraged completion of each exercise during down time to increase UB strength to increase participation with functional transfers and sit > stand.  Therapy Documentation Precautions:  Precautions Precautions: Back, Fall Precaution Booklet Issued: No Precaution Comments: instructed in back precautions Required Braces or Orthoses: Spinal Brace Spinal Brace: Thoracolumbosacral orthotic, Applied in sitting position Restrictions Weight Bearing Restrictions: No General:   Vital Signs: Therapy Vitals Temp: 98.8 F (37.1 C) Temp Source: Oral Pulse Rate: (!) 110 Resp: 17 BP: 134/71 mmHg Patient Position (if appropriate): Lying Oxygen Therapy SpO2: 99 % O2 Device: Not Delivered Pain: Pain Assessment Pain Assessment: 0-10 Pain Score: 7  Pain Type: Acute pain Pain Location: Back Pain Orientation: Lower Pain Descriptors / Indicators: Throbbing Pain Frequency: Constant Pain Onset: On-going Patients Stated Pain Goal: 5 Pain Intervention(s): Medication (See eMAR) ADL: ADL ADL Comments: refer to functional navigator  See Function Navigator for Current Functional Status.   Therapy/Group: Individual Therapy  Rosalio LoudHOXIE, Beonca Gibb 11/14/2015, 8:57 AM

## 2015-11-15 ENCOUNTER — Encounter (HOSPITAL_COMMUNITY): Payer: PRIVATE HEALTH INSURANCE

## 2015-11-15 ENCOUNTER — Inpatient Hospital Stay (HOSPITAL_COMMUNITY): Payer: Worker's Compensation | Admitting: Physical Therapy

## 2015-11-15 ENCOUNTER — Inpatient Hospital Stay (HOSPITAL_COMMUNITY): Payer: Worker's Compensation | Admitting: Occupational Therapy

## 2015-11-15 DIAGNOSIS — F4321 Adjustment disorder with depressed mood: Secondary | ICD-10-CM

## 2015-11-15 MED ORDER — BISACODYL 10 MG RE SUPP: 10.0000 mg | Freq: Every day | RECTAL | Status: DC

## 2015-11-15 MED ORDER — BISACODYL 10 MG RE SUPP
10.0000 mg | Freq: Every day | RECTAL | Status: DC
  Administered 2015-11-16 – 2015-12-02 (×15): 10 mg via RECTAL
  Filled 2015-11-15 (×16): qty 1

## 2015-11-15 NOTE — Progress Notes (Signed)
Physical Therapy Session Note  Patient Details  Name: Marie [name] MRN: [id number] Date of Birth: 04/07/1991  Today's Date: 11/15/2015 PT Individual Time: 1400-1530 and 0900-1000 PT Individual Time Calculation (min): 90 min and 60 min (total 150 min)   Short Term Goals: Week 1:  PT Short Term Goal 1 (Week 1): Pt will perform supine >sit minA PT Short Term Goal 2 (Week 1): Pt will perform sit >supine modA PT Short Term Goal 3 (Week 1): Pt will perform lateral scoot transfer w/c <>bed max A +1 PT Short Term Goal 4 (Week 1): Pt will perform static standing x2 min with LRAD   Skilled Therapeutic Interventions/Progress Updates:    Tx 1: Pt received supine in bed, no c/o pain and agreeable to treatment. Rolling to L with modA to maintain back precautions. Supine>sit modA with increased time. Sitting balance SBA while therapist dons TLSO totalA. Transfer bed> drop arm BSC with transfer board and +2A. Leaning R/L to remove pants with difficulty due to body habitus, with pt ultimately deciding to perform multiple chair pushups to A with removing brief and donning new one. Pt unable to void at this time. Transfer BSC>w/c with +2A using transfer board. W/c propulsion to gym 175' with S. Standing frame 2 trials x7-8 min each. No evidence of symptomatic hypotension with standing, and vitals remained WNL throughout. While standing pt performed glute/quad sets 3x10 reps, and attempted mini squats on second trial however pt notes heavy UE reliance. Returned to room totalA for energy conservation; remained seated in w/c with all needs in reach at completion of session. Educated pt on goals for therapy primarily at w/c level, and including sit <>stand, but will progress and add ambulation goals when/if appropriate; pt agreeable.   Tx 2: Pt received supine in bed, no c/o pain and agreeable to treatment. Rolling L with modA. Supine>sit with modA. Transfer bed>w/c maxA with transfer board. W/c propulsion to gym  [phone]' with S. Transfer w/c<>mat with transfer board and maxA. Supine LE strengthening exercises including hip IR, bridging, glute sets, heel slides, hip abduction/adduction with maxislide to reduce friction and increase success. Pt had incontinent bowel movement while on mat table; performed rolling R/L to A with removing/donning brief and pants, hygiene performed totalA. From edge of mat table 1 set 5 reps sit <>stand with BUEs on RW and modA +2 with facilitation at B quads for knee control. W/c propulsion outdoors on level/unlevel surfaces 3x300' with min guard on inclines/declines, S otherwise. Returned to room and transferred w/c >bed maxA with transfer board. Sit >supine with cues for L elbow on bed to decrease immediate rolling to back, with improved performance. Remained in R sidelying with pillows arranged for comfort and pressure relief; all needs in reach at completion of session.    Therapy Documentation Precautions:  Precautions Precautions: Back, Fall Precaution Booklet Issued: No Precaution Comments: instructed in back precautions Required Braces or Orthoses: Spinal Brace Spinal Brace: Thoracolumbosacral orthotic, Applied in sitting position Restrictions Weight Bearing Restrictions: No Pain: Pain Assessment Pain Assessment: No/denies pain Pain Score: 0-No pain Pain Type: Acute pain Pain Location: Back Pain Orientation: Lower Pain Descriptors / Indicators: Throbbing Pain Intervention(s): Medication (See eMAR)   See Function Navigator for Current Functional Status.   Therapy/Group: Individual Therapy  Vista Lawmanlizabeth J Tygielski 11/15/2015, 3:35 PM

## 2015-11-15 NOTE — Progress Notes (Signed)
Suppository given this morning to facilitate bowel movement.  Patient had some soft stool in rectal vault.  [facility] during [facility] caused patient to have small amount of liquid stool release.  [facility] to [facility], patient was unable to empty.  Notified Deatra Inaan Angiulli, PA who recommended starting bowel program with suppository and digital stimulation every morning to see if she empties.  Will continue to monitor. Dani Gobbleeardon, Khing Belcher J, RN

## 2015-11-15 NOTE — Progress Notes (Signed)
Occupational Therapy Session Note  Patient Details  Name: Marie [name] MRN: [id number] Date of Birth: 05/23/1991  Today's Date: 11/15/2015 OT Individual Time: 1035-1200 OT Individual Time Calculation (min): 85 min    Short Term Goals: Week 1:  OT Short Term Goal 1 (Week 1): Pt will transfer to wide drop arm BSC with max A x1. OT Short Term Goal 2 (Week 1): Pt will be able to roll in bed with min A to assist caregivers with LB self care. OT Short Term Goal 3 (Week 1): Pt will be able to use reacher to don pants over feet with min A. OT Short Term Goal 4 (Week 1): Pt will be able to bridge hips with min A in bed to pull pants over hips.  Skilled Therapeutic Interventions/Progress Updates:    Treatment session with focus on activity tolerance, sit > stand, standing tolerance, and ADL retraining.  Pt received upright in w/c wanting to complete bathing and dressing.  Willing to attempt standing with Stedy during LB hygiene and dressing this session to increase standing tolerance.  UB bathing completed with setup, total assist to don TLSO. +2 with sit > stand with pt pulling up on Stedy bar with +2 to facilitate anterior weight shift and lift buttocks into standing.  Multiple sit > stand (~8) to complete perineal hygiene, attempt BM, and LB dressing all with use of Stedy.  Pt able to complete approx 30% of sit > stand initially and by end of session approx 10%.  Returned to bed per pt request with use of Stedy.  At bed level engaged in BLE exercises with focus on knee flexion and ankle rotation to assist with bed mobility for hygiene and dressing.  Pt left in sidelying per request, demonstrating increased ability to bend Rt knee to assist with rolling.  Therapy Documentation Precautions:  Precautions Precautions: Back, Fall Precaution Booklet Issued: No Precaution Comments: instructed in back precautions Required Braces or Orthoses: Spinal Brace Spinal Brace: Thoracolumbosacral orthotic,  Applied in sitting position Restrictions Weight Bearing Restrictions: No Pain: Pain Assessment Pain Assessment: 0-10 Pain Score: 6  Pain Type: Acute pain Pain Location: Back Pain Orientation: Lower Pain Descriptors / Indicators: Throbbing Pain Intervention(s): Medication (See eMAR) ADL: ADL ADL Comments: refer to functional navigator  See Function Navigator for Current Functional Status.   Therapy/Group: Individual Therapy  Rosalio LoudHOXIE, Zade Falkner 11/15/2015, 12:26 PM
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
## 2015-11-15 NOTE — Progress Notes (Signed)
Social Work Patient ID: Marie [name], female   DOB: Nov 27, 1990, 25 y.o.   MRN: [id number] Met with pt who reports she had a good day today, she is tired but it is a good tired. Now she is ready to get back in bed for a little bit.  She was seeing a Internal Medicine MD and have placed his name in the chart for follow up purposes.

## 2015-11-16 ENCOUNTER — Inpatient Hospital Stay (HOSPITAL_COMMUNITY): Payer: Worker's Compensation | Admitting: Occupational Therapy

## 2015-11-16 ENCOUNTER — Inpatient Hospital Stay (HOSPITAL_COMMUNITY): Payer: Worker's Compensation | Admitting: *Deleted

## 2015-11-16 DIAGNOSIS — G4700 Insomnia, unspecified: Secondary | ICD-10-CM | POA: Insufficient documentation
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
## 2015-11-16 NOTE — Plan of Care (Signed)
Problem: SCI BLADDER ELIMINATION Goal: RH STG MANAGE BLADDER WITH MEDICATION WITH ASSISTANCE STG Manage Bladder With Medication With max Assistance.  Outcome: Not Progressing Patient needing bladder scan and intermittent catheterization

## 2015-11-16 NOTE — Progress Notes (Signed)
Physical Therapy Session Note  Patient Details  Name: Marie [name] MRN: [id number] Date of Birth: 03/11/1991  Today's Date: 11/16/2015 PT Individual Time: 1415-1530 PT Individual Time Calculation (min): 75 min   Short Term Goals: Week 1:  PT Short Term Goal 1 (Week 1): Pt will perform supine >sit minA PT Short Term Goal 2 (Week 1): Pt will perform sit >supine modA PT Short Term Goal 3 (Week 1): Pt will perform lateral scoot transfer w/c <>bed max A +1 PT Short Term Goal 4 (Week 1): Pt will perform static standing x2 min with LRAD   Skilled Therapeutic Interventions/Progress Updates:    Tx focused on WC propulsion, standing frame, therex, and sitting balance.   Pt performed WC in controlled setting 2x150' with S and cues for stroke efficiency and technique for scapular depression.   Standing frame 1x15 min with dual task card game to reduce UE reliance. HR and BP increased during activity, but resolved during seated rest, RN made aware. Sit[place] with up to 30 sec stand and postural cues. Pt with good scooting technique in WC.   Pt instructed in seated therex for LE strengthening with handouts to perform over weekend x10 each bil: LAQ, marching, hip ADD squeeze. Pt instructed in supine glute sets.   Pt preferred transfer back to bed with Stedy due to fatigue, and agreeable by PT due to elevated HR. Sit>supine via good logroll with mod A for bil LEs and Mod A for rolling to remove brace.    Therapy Documentation Precautions:  Precautions Precautions: Back, Fall Precaution Booklet Issued: No Precaution Comments: instructed in back precautions Required Braces or Orthoses: Spinal Brace Spinal Brace: Thoracolumbosacral orthotic, Applied in sitting position Restrictions Weight Bearing Restrictions: No    Vital Signs: Therapy Vitals Temp: 99.3 F (37.4 C) Temp Source: Oral Pulse Rate: (!) 118 Resp: 18 BP: 128/67 mmHg Patient Position (if appropriate):  Sitting Oxygen Therapy SpO2: 98 % O2 Device: Not Delivered Pain: none    See Function Navigator for Current Functional Status.   Therapy/Group: Individual Therapy   Clydene Lamingole Kellyn Mansfield, PT, DPT  11/16/2015, 2:47 PM

## 2015-11-16 NOTE — Plan of Care (Signed)
Problem: SCI BOWEL ELIMINATION Goal: RH STG MANAGE BOWEL WITH ASSISTANCE STG Manage Bowel with max Assistance.  Outcome: Not Progressing Patient to start with bowel program tonite

## 2015-11-16 NOTE — Progress Notes (Signed)
Occupational Therapy Session Note  Patient Details  Name: Marie [name] MRN: [id number] Date of Birth: 10/19/1991  Today's Date: 11/16/2015 OT Individual Time: 0850-1000 and 1300-1345 OT Individual Time Calculation (min): 70 min and 45 min Pt missed 20 mins secondary to RN care   Short Term Goals: Week 1:  OT Short Term Goal 1 (Week 1): Pt will transfer to wide drop arm BSC with max A x1. OT Short Term Goal 2 (Week 1): Pt will be able to roll in bed with min A to assist caregivers with LB self care. OT Short Term Goal 3 (Week 1): Pt will be able to use reacher to don pants over feet with min A. OT Short Term Goal 4 (Week 1): Pt will be able to bridge hips with min A in bed to pull pants over hips.  Skilled Therapeutic Interventions/Progress Updates:    1) Treatment session with focus on bed mobility, transfers, and increased participation in self-care tasks.  Pt in bed upon arrival having bladder scanned and catheterized by RN and nurse tech, therefore missed 20 mins.  Engaged in LB bathing at bed level with pt able to roll Rt and Lt with mod assist after setup assist to bend knees to assist in rolling.  Therapist washed buttocks in sidelying and pt utilized bed settings to wash perineal area and upper legs.  Rolling again with mod assist and heavy use of bed rails to assist with LB dressing.  Sidelying to sitting at EOB with min assist with pt demonstrating increased strength and control with movement.  Lateral scoot bed > w/c with use of slide board and +2 for safety with pt able to complete with mod assist and increased time, utilized block under feet to increase BLE support.  UB bathing and dressing completed from w/c at sink with setup assist for items and to don/doff TLSO.  Pt completed grooming seated at sink without assist.  2) Treatment session with focus on transfer training and trunk control during dynamic activity.  Pt in bed upon arrival, performed slide board transfer to w/c with  min assist and assist to position slide board and step under feet.  Pt performing scooting motion, educated on increased push up with transfer to not sheer buttocks on slide board.  Engaged in BUE strengthening and trunk control in sitting at edge of mat progressing to unsupported sitting with ball toss.  Pt alternating tapping ball with each hand, then progressed to tapping ball with 4# dowel rod.  Returned to w/c as above with lateral scoot on slide board with min assist and setup.  Therapy Documentation Precautions:  Precautions Precautions: Back, Fall Precaution Booklet Issued: No Precaution Comments: instructed in back precautions Required Braces or Orthoses: Spinal Brace Spinal Brace: Thoracolumbosacral orthotic, Applied in sitting position Restrictions Weight Bearing Restrictions: No Pain:  Pt reports pain 5/10 in back, premedicated ADL: ADL ADL Comments: refer to functional navigator  See Function Navigator for Current Functional Status.   Therapy/Group: Individual Therapy  Rosalio LoudHOXIE, Antigone Crowell 11/16/2015, 9:49 AM

## 2015-11-17 ENCOUNTER — Inpatient Hospital Stay (HOSPITAL_COMMUNITY): Payer: Worker's Compensation | Admitting: Physical Therapy

## 2015-11-17 MED ORDER — DOCUSATE SODIUM 100 MG PO CAPS
100.0000 mg | ORAL_CAPSULE | Freq: Three times a day (TID) | ORAL | Status: DC
  Administered 2015-11-17 – 2015-12-03 (×46): 100 mg via ORAL
  Filled 2015-11-17 (×46): qty 1

## 2015-11-17 NOTE — Progress Notes (Signed)
Physical Therapy Session Note  Patient Details  Name: Marie [name] MRN: [id number] Date of Birth: 01/28/1991  Today's Date: 11/17/2015 PT Individual Time: 0900-1000 PT Individual Time Calculation (min): 60 min   Short Term Goals: Week 1:  PT Short Term Goal 1 (Week 1): Pt will perform supine >sit minA PT Short Term Goal 2 (Week 1): Pt will perform sit >supine modA PT Short Term Goal 3 (Week 1): Pt will perform lateral scoot transfer w/c <>bed max A +1 PT Short Term Goal 4 (Week 1): Pt will perform static standing x2 min with LRAD   Skilled Therapeutic Interventions/Progress Updates:    Pt received supine in bed, no c/o pain and agreeable to treatment. Requesting to use restroom before leaving room. Rolling to L side with modA and bedrails. Supine>sit minA with bedrails and A for LE management. Sitting on EOB, donned TLSO totalA. Sit <>stand with Stedy x6 trials total while transferring onto BSC, donning/doffing pants. Requires mod/maxA with Stedy and pt notes compensating heavily with UEs. W/c propulsion to/from gym 2x150' with S. BLE things overlapping towards wheels of w/c and concern about skin integrity; however unable to locate larger lightweight w/c at this time. Will reassess tomorrow with assistance from supervisor. Kinetron x5 min with BLE for strengthening, NMR, aerobic endurance. Sit <>stand with three muskateers +2 assistance to reduce reliance on UE's and forced use of LEs. Cueing at glutes and quads for hip/knee extension. Returned to room and remained seated in w/c at completion of session, all needs within reach. Discussed with pt possibility of attempting voiding 30-45 min before therapy to allow sessions to focus on other mobility tasks; wrote note on whiteboard in pt room and plan to discuss with nursing tomorrow morning.   Therapy Documentation Precautions:  Precautions Precautions: Back, Fall Precaution Booklet Issued: No Precaution Comments: instructed in back  precautions Required Braces or Orthoses: Spinal Brace Spinal Brace: Thoracolumbosacral orthotic, Applied in sitting position Restrictions Weight Bearing Restrictions: No Pain: Pain Assessment Pain Assessment: No/denies pain Pain Score: 0-No pain   See Function Navigator for Current Functional Status.   Therapy/Group: Individual Therapy  Vista Lawmanlizabeth J Tygielski 11/17/2015, 10:14 AM

## 2015-11-17 NOTE — Progress Notes (Signed)
11/17/15 1553 nursing Dressing change done this morning with minimal drainage. This pm gauze  dressing noted to be saturated. Wound clean and healing; staples intact. Used abdominal gauze to cover incision.

## 2015-11-17 NOTE — Progress Notes (Signed)
Bowel program started at 8pm, 1/14. Suppositor given at 2010, dig stim performed around 2045, no bowel detected in the rectal vault. Very small amount of brown liquid resulted. [facility] with no success of a BM. Assisted patient back to bed 2 assist with stedy tx. Will continue to assess.  Marie [name], Marie [name]

## 2015-11-17 NOTE — Plan of Care (Signed)
Problem: SCI BOWEL ELIMINATION Goal: RH STG MANAGE BOWEL WITH ASSISTANCE STG Manage Bowel with max Assistance.  Outcome: Not Progressing Bowel program started no bm yet. MD made aware  Problem: SCI BLADDER ELIMINATION Goal: RH STG MANAGE BLADDER WITH MEDICATION WITH ASSISTANCE STG Manage Bladder With Medication With max Assistance.  Outcome: Not Progressing In and out cath due to retention

## 2015-11-17 NOTE — Progress Notes (Signed)
11/17/15 0800 nursing MD made aware of  patients heart rate 100-120. No new orders but to encourage fluid intake.Patient made aware to increase fluid intake.
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
## 2015-11-18 ENCOUNTER — Inpatient Hospital Stay (HOSPITAL_COMMUNITY): Payer: Worker's Compensation | Admitting: Physical Therapy

## 2015-11-18 ENCOUNTER — Inpatient Hospital Stay (HOSPITAL_COMMUNITY): Payer: Worker's Compensation

## 2015-11-18 DIAGNOSIS — G834 Cauda equina syndrome: Secondary | ICD-10-CM

## 2015-11-18 DIAGNOSIS — F4321 Adjustment disorder with depressed mood: Secondary | ICD-10-CM | POA: Insufficient documentation

## 2015-11-18 LAB — CBC WITH DIFFERENTIAL/PLATELET
BASOS ABS: 0 10*3/uL (ref 0.0–0.1)
BASOS PCT: 0 %
EOS ABS: 0.2 10*3/uL (ref 0.0–0.7)
EOS PCT: 2 %
HEMATOCRIT: 30.4 % — AB (ref 36.0–46.0)
Hemoglobin: 9.9 g/dL — ABNORMAL LOW (ref 12.0–15.0)
Lymphocytes Relative: 18 %
Lymphs Abs: 1.9 10*3/uL (ref 0.7–4.0)
MCH: 27.9 pg (ref 26.0–34.0)
MCHC: 32.6 g/dL (ref 30.0–36.0)
MCV: 85.6 fL (ref 78.0–100.0)
MONO ABS: 0.9 10*3/uL (ref 0.1–1.0)
MONOS PCT: 9 %
NEUTROS ABS: 7.5 10*3/uL (ref 1.7–7.7)
Neutrophils Relative %: 71 %
PLATELETS: 312 10*3/uL (ref 150–400)
RBC: 3.55 MIL/uL — ABNORMAL LOW (ref 3.87–5.11)
RDW: 13.6 % (ref 11.5–15.5)
WBC: 10.5 10*3/uL (ref 4.0–10.5)

## 2015-11-18 MED ORDER — LIDOCAINE HCL 2 % EX GEL: 1.0000 "application " | Freq: Once | CUTANEOUS | Status: DC

## 2015-11-18 MED ORDER — MAGNESIUM CITRATE PO SOLN
1.0000 | Freq: Once | ORAL | Status: AC
  Administered 2015-11-18: 1 via ORAL
  Filled 2015-11-18: qty 296

## 2015-11-18 NOTE — Progress Notes (Signed)
Occupational Therapy Note  Patient Details  Name: Cherrie Distancenna G Corney MRN: [id number] Date of Birth: 10/30/1991  Today's Date: 11/18/2015 OT Individual Time: 1330-1430 OT Individual Time Calculation (min): 60 min   Pt c/o 5/10 pain in lower back; repositioned Individual Therapy  Pt resting in bed upon arrival.  Pt initially engaged in rolling to right and left to facilitate donning pants.  Pt able to raise right knee off bed but requires assistance with left knee.  Pt rolls to both sides with min A using side rails.  Pt requires min A for supine->sit EOB using bed rails and assistance with moving her right leg off the EOB.  Pt pushes up to sitting without assistance. Pt maintained sitting balance to doff/don shirt without assistance.  Pt assists with donning TLSO while seated EOB.  Pt performs sliding board transfer with assistance only to repositioned BLE during transfer.  Pt propelled to therapy gym and engaged in BUE therex-w/c pushups 3 X 10 with block under BLE to facilitate pt pushing through BLE while performing pushup.  Pt also engaged in rowing with theraband 3 X 12.  Pt remained in w/c awaiting PT.    Lavone NeriLanier, Abdulahad Mederos [facility] 11/18/2015, 2:43 PM
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
## 2015-11-18 NOTE — Progress Notes (Signed)
No BM after supp and dig.stim. No void-@2345  c/o pain to abd, I&0 cath=500, @0400  c/o abd. Pressure,I&O cath=400. PRN OXY IR given at 0050 for c/o back pain. Dressing saturated with serosanguineous drainage-changed at 2230 & 0630. Middle part of incision oozing, and distal part with small area starting to dehisce. Made Dr. Doroteo BradfordKirstein aware. Alfredo MartinezMurray, Essense Bousquet A

## 2015-11-18 NOTE — Progress Notes (Signed)
Occupational Therapy Session Note  Patient Details  Name: Marie [name] MRN: [id number] Date of Birth: 03/06/1991  Today's Date: 11/18/2015 OT Individual Time: 0805-0900 OT Individual Time Calculation (min): 55 min    Short Term Goals: Week 1:  OT Short Term Goal 1 (Week 1): Pt will transfer to wide drop arm BSC with max A x1. OT Short Term Goal 2 (Week 1): Pt will be able to roll in bed with min A to assist caregivers with LB self care. OT Short Term Goal 3 (Week 1): Pt will be able to use reacher to don pants over feet with min A. OT Short Term Goal 4 (Week 1): Pt will be able to bridge hips with min A in bed to pull pants over hips.  Skilled Therapeutic Interventions/Progress Updates:    Pt resting in bed upon arrival.  Pt engaged in BADL retraining including LB bathing and dressing at bed level, and UB bathing and dressing at w/c level.  Pt requires min A for rolling in bed to facilitate donning pants.  Pt requires min A for supine->sit EOB in preparation for donning TLSO and performing sliding board transfer to w/c.  Pt completes UB bathing tasks with supervision/set up for donning TLSO.  Pt requires more than a reasonable amount of time to complete tasks with multiple rest breaks.  Pt is independent with directing care and asking for appropriate assistance.   Therapy Documentation Precautions:  Precautions Precautions: Back, Fall Precaution Booklet Issued: No Precaution Comments: instructed in back precautions Required Braces or Orthoses: Spinal Brace Spinal Brace: Thoracolumbosacral orthotic, Applied in sitting position Restrictions Weight Bearing Restrictions: No Pain: Pain Assessment Pain Assessment: 0-10 Pain Score: 5   RN aware; repositioned ADL: ADL ADL Comments: refer to functional navigator  See Function Navigator for Current Functional Status.   Therapy/Group: Individual Therapy  Rich BraveLanier, Tyjah Hai Chappell 11/18/2015, 9:02 AM

## 2015-11-18 NOTE — Progress Notes (Signed)
Social Work Patient ID: Marie [name], female   DOB: 03/26/1991, 25 y.o.   MRN: [id number] Discussed with pt and [name] the need to begin application for SSD. [name] to go to local office and begin the application this worker can assist with MD and medical Conditions part of the application. The discussed applying for Medicaid along with SSD. 

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
## 2015-11-18 NOTE — Plan of Care (Signed)
Problem: SCI BOWEL ELIMINATION Goal: RH STG MANAGE BOWEL WITH ASSISTANCE STG Manage Bowel with max Assistance.  Outcome: Not Progressing Pt has yet to have success in bowel movement.

## 2015-11-18 NOTE — Progress Notes (Signed)
Physical Therapy Session Note  Patient Details  Name: Marie [name] MRN: [id number] Date of Birth: 02/19/1991  Today's Date: 11/18/2015 PT Individual Time: 1000-1100 and 1430-1530 PT Individual Time Calculation (min): 60 min and 60 min (total 120 min)   Short Term Goals: Week 1:  PT Short Term Goal 1 (Week 1): Pt will perform supine >sit minA PT Short Term Goal 2 (Week 1): Pt will perform sit >supine modA PT Short Term Goal 3 (Week 1): Pt will perform lateral scoot transfer w/c <>bed max A +1 PT Short Term Goal 4 (Week 1): Pt will perform static standing x2 min with LRAD   Skilled Therapeutic Interventions/Progress Updates:    Tx 1: Pt received seated in bed, no c/o pain and agreeable to treatment. Rolling onto L side x2 to A with dressing change by RN. Supine>sit modA for LEs off edge of bed, then minA overall. Lateral scoot using transfer board to R side into w/c with minA; requires assist for board placement and removal, verbal cues for larger scoots to clear hips off board rather than small scoots. W/c propulsion to/from gym x150' with S. Kinetron 2 x 5 min with BLE for strengthening and endurance. Sit <>stand 4 sets of 5 reps with three muskateers to facilitate LE assistance and reduce reliance on UEs. Standing frame x10 min with dynamic BUE task to increase weight bearing in BLEs. Returned to room totalA for energy conservation. Transfer w/c >bed as before minA. Sit >supine modA for LE management.   Tx 2: Pt received seated in w/c with handoff from OT in gym after previous session. Performed kinteron x10 min with BLE, occasional rest breaks due to fatigue. Sit <>stand x4 trials from elevated edge of mat table with +2 A and UEs on Eva walker. Pt reports incontinent bowel movement upon standing and requests to return to room to change. Using [place], transfer w/c >BSC>bed with modA and improved UE assistance and standing balance. Sit <>stand x4 trials in Stedy to don/doff pants and brief,  assist with hygiene totalA. Returned to bed, sit >supine modA for BLE management. Pt remained supine in bed with all needs in reach at completion of session.   Therapy Documentation Precautions:  Precautions Precautions: Back, Fall Precaution Booklet Issued: No Precaution Comments: instructed in back precautions Required Braces or Orthoses: Spinal Brace Spinal Brace: Thoracolumbosacral orthotic, Applied in sitting position Restrictions Weight Bearing Restrictions: No Pain: Pain Assessment Pain Assessment: No/denies pain Pain Score: 0-No pain   See Function Navigator for Current Functional Status.   Therapy/Group: Individual Therapy  Vista Lawmanlizabeth J Tygielski 11/18/2015, 12:12 PM

## 2015-11-19 ENCOUNTER — Inpatient Hospital Stay (HOSPITAL_COMMUNITY): Payer: Worker's Compensation

## 2015-11-19 ENCOUNTER — Inpatient Hospital Stay (HOSPITAL_COMMUNITY): Payer: Worker's Compensation | Admitting: *Deleted

## 2015-11-19 ENCOUNTER — Inpatient Hospital Stay (HOSPITAL_COMMUNITY): Payer: Worker's Compensation | Admitting: Occupational Therapy

## 2015-11-19 DIAGNOSIS — G9581 Conus medullaris syndrome: Secondary | ICD-10-CM | POA: Diagnosis present

## 2015-11-19 NOTE — Progress Notes (Signed)
Occupational Therapy Session Note  Patient Details  Name: Marie [name] MRN: [id number] Date of Birth: [birth year]  Today's Date: 11/19/2015 OT Individual Time: 4098-1191 and 1335-1430 OT Individual Time Calculation (min): 55 min (60 scheduled and 20 mins make up time) OT Co-Treatment Time: 1430-1445 (1430-1500) OT Co-Treatment Time Calculation (min): 15 min    Short Term Goals: Week 1:  OT Short Term Goal 1 (Week 1): Pt will transfer to wide drop arm BSC with max A x1. OT Short Term Goal 2 (Week 1): Pt will be able to roll in bed with min A to assist caregivers with LB self care. OT Short Term Goal 3 (Week 1): Pt will be able to use reacher to don pants over feet with min A. OT Short Term Goal 4 (Week 1): Pt will be able to bridge hips with min A in bed to pull pants over hips.  Skilled Therapeutic Interventions/Progress Updates:    1) Treatment session with focus on bed mobility, functional transfers, and ADL retraining.  Completed LB bathing and dressing at bed level with focus on bed mobility, pt with improved Rt and Lt knee flexion this session increasing participation in rolling.  Pt continues to require total assist with LB dressing.  Min assist supine to sitting at EOB with heavy reliance on bed rails, pushing up to sitting without assistance.  Pt with improved participation with slide board transfer and ability to reposition BLE during transfer.  Therapist doffed TLSO to allow pt to complete UB bathing and dressing at sink then donned TLSO.  Encouraged pt to continue to direct her care with RN and nurse techs to promote carryover of technique and awareness of safety and positioning.    2) Treatment session with focus on sit > stand and maintaining standing balance during therapeutic activity.  Pt in bed upon arrival without pants on.  Educated on use of reacher to complete LB dressing with pt able to thread both pants legs at bed level with reacher.  Pt able to pull pants over hips at  bed level in sidelying.  Slide board transfer to w/c with setup assist and assistance to position BLE during transfer.  Engaged in sit > stand with use of Stedy to engage in Wii bowling.  Pt completed sit > stand x10 with min - supervision with UE support on Stedy bar and tolerated standing 15-30 seconds at a time.  Co-treat with PT for last 30 mins with focus anterior weight shift and sit > stand without pulling up on surface.  Three musketeers sit > stand with mod assist +2.  Progressed to attempting sit > stand with Huntley Dec lift to allow UE support in standing and sling to progress to attempting ambulation.  Pt passed off to PT.  Therapy Documentation Precautions:  Precautions Precautions: Back, Fall Precaution Booklet Issued: No Precaution Comments: instructed in back precautions Required Braces or Orthoses: Spinal Brace Spinal Brace: Thoracolumbosacral orthotic, Applied in sitting position Restrictions Weight Bearing Restrictions: No Pain:  Pt with c/o pain in lower back 4/10, premedicated ADL: ADL ADL Comments: refer to functional navigator  See Function Navigator for Current Functional Status.   Therapy/Group: Individual Therapy and Co-Treatment  Marie [name] 11/19/2015, 10:20 AM

## 2015-11-19 NOTE — Progress Notes (Signed)
Physical Therapy Session Note  Patient Details  Name: Marie [name] MRN: [id number] Date of Birth: August 02, 1991  Today's Date: 11/19/2015 PT Individual Time: 1000-1100 PT Individual Time Calculation (min): 60 min  And Today's Date: 11/19/2015 PT Co-Treatment Time: 1515-1530 PT Co-Treatment Time Calculation (min): 15 min   Short Term Goals: Week 1:  PT Short Term Goal 1 (Week 1): Pt will perform supine >sit minA PT Short Term Goal 2 (Week 1): Pt will perform sit >supine modA PT Short Term Goal 3 (Week 1): Pt will perform lateral scoot transfer w/c <>bed max A +1 PT Short Term Goal 4 (Week 1): Pt will perform static standing x2 min with LRAD   Skilled Therapeutic Interventions/Progress Updates:   Tx 1: Pt received seated in w/c, no c/o pain and agreeable to treatment. W/c propulsion x150' to gym with S. Transfer w/c <>Nustep minA for placement of board and step for LE support and stability. Nustep x 10 min total with several rest breaks needed due to fatigue. RLE hip abd/ER prevention attachment used for second half to maintain LE alignment due to fatigue. Standing frame x10 min with BUE engaged in card game. 10 reps quad set after each time pt took a turn in the game. Vitals assessed although pt asymptomatic; BP 132/110 HR 140. Returned to seated position and returned to room totalA for energy conservation. W/c replaced temporarily with reclining w/c to allow for wheels on light weight chair to be adjusted for better fit/alignment. Sit <>stand with Stedy modA, pants and brief changed as clothing damp from washing hair in OT session. Returned to bed with Stedy, sit >supine modA for LE management. Scooting towards HOB +2A with pt assisting using LEs and UEs. Remained supine in bed at completion of session, all needs in reach.   Tx 2: Co-treat with PT Jill Side to focus on sit <>stand, pre-gait activities, and bed mobility. Performed sit <>stands in [place] with BUE support x4 trials. Verbal/tactile cues  for quad/glute activation, hip extension, weight shifting with small LE scooting forward/backward with modA. Transfer w/c >bed with transfer board minA for placement of board, verbal cues for large scoots and unweighing hips to reduce shear forces on bottom. Sit >supine modA for LE management. Attempted use of leg lifter to raise RLE onto bed however pt reports trunk rotation and increase in pain. Remained supine in bed at completion of session, all needs within reach. Nurse secretary informed of dressing leaking d/t RN off unit.   Therapy Documentation Precautions:  Precautions Precautions: Back, Fall Precaution Booklet Issued: No Precaution Comments: instructed in back precautions Required Braces or Orthoses: Spinal Brace Spinal Brace: Thoracolumbosacral orthotic, Applied in sitting position Restrictions Weight Bearing Restrictions: No Pain: Pain Assessment Pain Score: 2  Pain Type: Surgical pain Pain Location: Back Pain Descriptors / Indicators: Aching Pain Intervention(s): Medication (See eMAR)   See Function Navigator for Current Functional Status.   Therapy/Group: Individual Therapy  Vista Lawman 11/19/2015, 12:34 PM

## 2015-11-19 NOTE — Plan of Care (Signed)
Problem: RH PAIN MANAGEMENT Goal: RH STG PAIN MANAGED AT OR BELOW PT'S PAIN GOAL Pain less than 4  Outcome: Not Progressing Rates pain a 7/10 or higher
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
## 2015-11-19 NOTE — Progress Notes (Signed)
Physical Therapy Session Note  Patient Details  Name: Marie [name] MRN: [id number] Date of Birth: 05-04-1991  Today's Date: 11/19/2015 PT Co-Treatment Time: 1445-1515 (Entire co-treatment time 1430-1530 with OT first 30 min and PT last 30 min) PT Co-Treatment Time Calculation (min): 30 min  Short Term Goals: Week 1:  PT Short Term Goal 1 (Week 1): Pt will perform supine >sit minA PT Short Term Goal 2 (Week 1): Pt will perform sit >supine modA PT Short Term Goal 3 (Week 1): Pt will perform lateral scoot transfer w/c <>bed max A +1 PT Short Term Goal 4 (Week 1): Pt will perform static standing x2 min with LRAD   Skilled Therapeutic Interventions/Progress Updates:    Co-treatment with OT and PT with focus on neuro re-ed for BLE motor control, weightbearing and proprioceptive input through BLE, postural control re-training and weightshifting for pre-gait, sit <-> stands and standing tolerance using +2 assist without equipment and then using Huntley Dec Plus for increased tolerance. Therapists providing facilitation at knees and hips/glutes for activation and for anterior weightshift during sit to stand, and during weighshifting to pick up R and L foot forward and backward attempts. Education provided to pt on progressing to trials for gait with [employer] or [employer] and working towards increasing tolerance and motor activation in standing.    Therapy Documentation Precautions:  Precautions Precautions: Back, Fall Precaution Booklet Issued: No Precaution Comments: instructed in back precautions Required Braces or Orthoses: Spinal Brace Spinal Brace: Thoracolumbosacral orthotic, Applied in sitting position Restrictions Weight Bearing Restrictions: No   Pain: Reports 5/10 back pain - premedicated.    See Function Navigator for Current Functional Status.   Therapy/Group: Individual Therapy  Karolee Stamps Darrol Poke, PT, DPT  11/19/2015, 3:39 PM

## 2015-11-19 NOTE — Progress Notes (Signed)
RN changed dressing to back twice throughout shift with copious serosanguinous drainage present. Incision pink with area to lower incision yellow and draining evident.  Will continue to monitor.

## 2015-11-20 ENCOUNTER — Inpatient Hospital Stay (HOSPITAL_COMMUNITY): Payer: Worker's Compensation | Admitting: Physical Therapy

## 2015-11-20 ENCOUNTER — Inpatient Hospital Stay (HOSPITAL_COMMUNITY): Payer: Worker's Compensation | Admitting: Occupational Therapy

## 2015-11-20 NOTE — Progress Notes (Signed)
Occupational Therapy Session Note  Patient Details  Name: Marie [name] MRN: [id number] Date of Birth: [birth year]  Today's Date: 11/20/2015 OT Individual Time: 0930-1030 OT Individual Time Calculation (min): 60 min    Short Term Goals: Week 1:  OT Short Term Goal 1 (Week 1): Pt will transfer to wide drop arm BSC with max A x1. OT Short Term Goal 1 - Progress (Week 1): Met OT Short Term Goal 2 (Week 1): Pt will be able to roll in bed with min A to assist caregivers with LB self care. OT Short Term Goal 2 - Progress (Week 1): Met OT Short Term Goal 3 (Week 1): Pt will be able to use reacher to don pants over feet with min A. OT Short Term Goal 3 - Progress (Week 1): Met OT Short Term Goal 4 (Week 1): Pt will be able to bridge hips with min A in bed to pull pants over hips. OT Short Term Goal 4 - Progress (Week 1): Progressing toward goal Week 2:  OT Short Term Goal 1 (Week 2): Pt will transfer to wide drop arm BSC with min assist OT Short Term Goal 2 (Week 2): Pt will tolerate sitting EOB for UB bathing/dressing without elevation of pain score > 2 points OT Short Term Goal 3 (Week 2): Pt will engage in simple meal prep with min assist w/c level OT Short Term Goal 4 (Week 2): Pt will be able to bridge hips with min A in bed to pull pants over hips  Skilled Therapeutic Interventions/Progress Updates:    Pt seen for skilled OT this session to facilitate LE ROM, LE strength, and transfer skills. Pt received in w/c. Practiced with long slide board w/c to bed, bed to [facility] and back.  Pt able to accomplish transfers with min A to steady board. The long board was difficult to get on [facility] in a comfortable manner, so practiced 2nd time with short slide board and this worked more effectively. Also practiced with Stedy lift in case pt would need to use [facility] urgently as she is going through B&B training. Pt worked on LE strengthening with sit to stand in [place] followed by 5 sets of mini squats/knee bends  with short rest breaks inbetween on hip pads of stedy.  Pt back in bed working on long sitting using leg lifter to facilitate hamstring stretch. Pt able to open R leg into partial circle sit, but had pain in L hip when attempting. Pt then layed back into bed to rest prior to next session.  All needs met.  Therapy Documentation Precautions:  Precautions Precautions: Back, Fall Precaution Booklet Issued: No Precaution Comments: instructed in back precautions Required Braces or Orthoses: Spinal Brace Spinal Brace: Thoracolumbosacral orthotic, Applied in sitting position Restrictions Weight Bearing Restrictions: No      Pain: Pain Assessment Pain Assessment: 0-10 Pain Score: 3  - premedicated  ADL: ADL ADL Comments: refer to functional navigator  See Function Navigator for Current Functional Status.   Therapy/Group: Individual Therapy  Lola Czerwonka 11/20/2015, 1:13 PM

## 2015-11-20 NOTE — Progress Notes (Signed)
Occupational Therapy Weekly Progress Note  Patient Details  Name: Marie [name] MRN: [id number] Date of Birth: 12/19/1990  Beginning of progress report period: November 13, 2015 End of progress report period: November 20, 2015  Today's Date: 11/20/2015 OT Individual Time: 3818-2993 and 1300-1400 OT Individual Time Calculation (min): 60 min and 60 min   Patient has met 3 of 4 short term goals.  Pt is making steady progress towards goals.  Pt currently requires min assist for rolling at bed level with use of bed rails for leverage.  LB bathing being completed at bed level with pt able to utilize long handled sponge and reacher to increase independence, continuing to require assist to wash buttocks and pull pants over hips in sidelying.  Lateral scoot transfers with slide board with min assist, stabilizing for support and assist to position BLE during transfer.    Patient continues to demonstrate the following deficits: BLE weakness, decreased trunk control and stability, decreased activity tolerance and therefore will continue to benefit from skilled OT intervention to enhance overall performance with BADL and Reduce care partner burden.  Patient progressing toward long term goals..  Continue plan of care.  OT Short Term Goals Week 1:  OT Short Term Goal 1 (Week 1): Pt will transfer to wide drop arm BSC with max A x1. OT Short Term Goal 1 - Progress (Week 1): Met OT Short Term Goal 2 (Week 1): Pt will be able to roll in bed with min A to assist caregivers with LB self care. OT Short Term Goal 2 - Progress (Week 1): Met OT Short Term Goal 3 (Week 1): Pt will be able to use reacher to don pants over feet with min A. OT Short Term Goal 3 - Progress (Week 1): Met OT Short Term Goal 4 (Week 1): Pt will be able to bridge hips with min A in bed to pull pants over hips. OT Short Term Goal 4 - Progress (Week 1): Progressing toward goal Week 2:  OT Short Term Goal 1 (Week 2): Pt will transfer to wide  drop arm BSC with min assist OT Short Term Goal 2 (Week 2): Pt will tolerate sitting EOB for UB bathing/dressing without elevation of pain score > 2 points OT Short Term Goal 3 (Week 2): Pt will engage in simple meal prep with min assist w/c level OT Short Term Goal 4 (Week 2): Pt will be able to bridge hips with min A in bed to pull pants over hips  Skilled Therapeutic Interventions/Progress Updates:    1) Treatment session with focus on ADL retraining with bed mobility, education on AE, sitting balance, and functional transfers.  Educated on use of reacher and long handled sponge with LB bathing and dressing, with pt requiring assist only with washing buttocks in sidelying and pulling pants fully over hips.  Engaged in bathing seated at EOB with focus on sitting balance and tolerance while completing UB self-care tasks prior to transfer to w/c.  Utilized stool under feet to promote BLE support to increase stability.  Pt with improved recall of strategies and able to direct care to assist with setup.  Slide board transfer to w/c with min/steady assist and support at BLE to promote stability and assist with repositioning.  Pt completed grooming tasks seated in w/c without assist.  2) Treatment session with focus on bed mobility, transfers, and increased participation in LB self-care tasks.  Pt in bed upon arrival, performed bed mobility with min assist for stability  but no lifting.  Slide board transfer to w/c with assist to place board and then stabilize at BLE.  Engaged in lateral leans in sitting to address increased ability to complete hygiene with toileting tasks and LB dressing from [facility]  Engaged in long sitting, progressing to circle sitting with RLE with pt able to doff and don Rt sock in circle sitting.  Hamstring stretches and knee flexion in long sitting to increase participation in self-care tasks.  Therapy Documentation Precautions:  Precautions Precautions: Back, Fall Precaution Booklet  Issued: No Precaution Comments: instructed in back precautions Required Braces or Orthoses: Spinal Brace Spinal Brace: Thoracolumbosacral orthotic, Applied in sitting position Restrictions Weight Bearing Restrictions: No General:   Vital Signs: Therapy Vitals Temp: 99 F (37.2 C) Temp Source: Oral Pulse Rate: 85 Resp: 18 BP: (!) 114/47 mmHg Oxygen Therapy SpO2: 95 % O2 Device: Not Delivered Pain: Pain Assessment Pain Assessment: 0-10 Pain Score: Asleep Pain Type: Surgical pain Pain Location: Back Pain Descriptors / Indicators: Aching Pain Onset: Awakened from sleep Pain Intervention(s): Medication (See eMAR) ADL: ADL ADL Comments: refer to functional navigator  See Function Navigator for Current Functional Status.   Therapy/Group: Individual Therapy  Simonne Come 11/20/2015, 7:29 AM
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
## 2015-11-20 NOTE — Progress Notes (Signed)
Physical Therapy Session Note  Patient Details  Name: Marie [name] MRN: [id number] Date of Birth: [birth year]  Today's Date: 11/20/2015 PT Individual Time: 1400-1500 PT Individual Time Calculation (min): 60 min   Short Term Goals: Week 1:  PT Short Term Goal 1 (Week 1): Pt will perform supine >sit minA PT Short Term Goal 2 (Week 1): Pt will perform sit >supine modA PT Short Term Goal 3 (Week 1): Pt will perform lateral scoot transfer w/c <>bed max A +1 PT Short Term Goal 4 (Week 1): Pt will perform static standing x2 min with LRAD   Skilled Therapeutic Interventions/Progress Updates:    Pt received seated on edge of mat table with handoff from OT after previous session; no c/o pain and agreeable to treatment. Lateral scoot with transfer board to w/c with minA for placement of board and stabilizing chair. Transfer > mat table under maxi-sky with pt directing care to new nursing employees being trained in transfers; modA overall and required increased assist to place board, and cues for scooting forward in chair to improve ease of transfer and board placement. Sit <>stand with maxi-sky and RW with BUE support from elevated surface with minA, +2 for safety. Attempted x2 without RW to reduce reliance on UEs however unable to reach full standing position with heavy maxA +2; fear of falling likely contributory as well as strength/neuromotor control deficits. Returned to room totalA for energy conservation. Transfer to bed minA as before using transfer board. Sit >supine modA for LE management. Bridging performed to A with positioning in bed with modA. Remained supine in bed at completion of session, all needs in reach.    Therapy Documentation Precautions:  Precautions Precautions: Back, Fall Precaution Booklet Issued: No Precaution Comments: instructed in back precautions Required Braces or Orthoses: Spinal Brace Spinal Brace: Thoracolumbosacral orthotic, Applied in sitting  position Restrictions Weight Bearing Restrictions: No Pain: Pain Assessment Pain Assessment: No/denies pain Pain Score: 0-No pain   See Function Navigator for Current Functional Status.   Therapy/Group: Individual Therapy  Vista Lawman 11/20/2015, 3:57 PM

## 2015-11-21 ENCOUNTER — Inpatient Hospital Stay (HOSPITAL_COMMUNITY): Payer: PRIVATE HEALTH INSURANCE | Admitting: Physical Therapy

## 2015-11-21 ENCOUNTER — Inpatient Hospital Stay (HOSPITAL_COMMUNITY): Payer: Worker's Compensation | Admitting: Occupational Therapy

## 2015-11-21 ENCOUNTER — Inpatient Hospital Stay (HOSPITAL_COMMUNITY): Payer: PRIVATE HEALTH INSURANCE | Admitting: Occupational Therapy

## 2015-11-21 MED ORDER — SODIUM CHLORIDE 0.9 % IJ SOLN: 10.0000 mL | INTRAMUSCULAR | Status: DC | PRN

## 2015-11-21 NOTE — Progress Notes (Signed)
Physical Therapy Weekly Progress Note  Patient Details  Name: Marie [name] MRN: [id number] Date of Birth: 14-Oct-1991  Beginning of progress report period: November 13, 2015 End of progress report period: November 21, 2015  Today's Date: 11/21/2015 PT Individual Time: 0800-0900 and 1300-1400 PT Individual Time Calculation (min): 60 min and 60 min (total 120 min)   Patient has met 3 of 4 short term goals.  Unmet goal includes standing tolerance x2 min with LRAD; unmet due to continued LE strength and endurance deficits. Pt is making significant progress towards goals; currently performing bed mobility min/modA with assistance for LE management, transfers with minA, and w/c propulsion on level surfaces with S. Have initiated pre-gait activities including sit <>stand and weight shifting with toe taps for single limb stance control and strengthening. Limited by LLE strength deficits>RLE.   Patient continues to demonstrate the following deficits: activity tolerance, balance, postural control, ability to compensate for deficits and functional use of  right lower extremity and left lower extremity and therefore will continue to benefit from skilled PT intervention to enhance overall performance with bed mobility, transfers, functional and therapeutic standing, w/c propulsion, home and community access.   Patient progressing toward long term goals..  Continue plan of care.  PT Short Term Goals Week 1:  PT Short Term Goal 1 (Week 1): Pt will perform supine >sit minA PT Short Term Goal 1 - Progress (Week 1): Met PT Short Term Goal 2 (Week 1): Pt will perform sit >supine modA PT Short Term Goal 2 - Progress (Week 1): Met PT Short Term Goal 3 (Week 1): Pt will perform lateral scoot transfer w/c <>bed max A +1 PT Short Term Goal 3 - Progress (Week 1): Met PT Short Term Goal 4 (Week 1): Pt will perform static standing x2 min with LRAD  PT Short Term Goal 4 - Progress (Week 1): Not met Week 2:  PT Short  Term Goal 1 (Week 2): Pt will perform supine>sit S and min verbal cues PT Short Term Goal 2 (Week 2): Pt will perform sit >supine minA PT Short Term Goal 3 (Week 2): Pt will perform lateral scoot transfer w/c <>bed set-up A PT Short Term Goal 4 (Week 2): Pt will perform static standing x2 min with LRAD PT Short Term Goal 5 (Week 2): Pt will perform ascent/descent of ramps, curbs with minA at w/c level  Skilled Therapeutic Interventions/Progress Updates:    Tx 1: Pt received supine in bed, no c/o pain and agreeable to treatment. Pt dons pants mod I with grabber, [name] bed features, and increased time. Supine>sit with log roll and standbyA. Transfer bed>w/c with Marie [name] for LE strengthening and endurance; standbyA for sit >stand. W/c propulsion 2 x 150' with minA initial trial due to w/c veering to L difficult for pt to correct. Pt returned to original chair before second trial of propulsion and performed S. Car transfer [place] into car, setup assist car back to w/c. Required cues for problem solving and foot placement to improve safety and efficiency of transfer. Transfer w/c <>bed setupA. Sit <>supine standbyA and increased time. ModA bridging to A with alignment/positioning in bed. While in bed, measured pt for leg loops to allow for increased independence and efficiency of mobility. Returned to room and remained seated in w/c at completion of session, all needs within reach.  Tx 2: Pt received seated in bed with no c/o pain and agreeable to treatment. Supine>sit with S and increased time. Transfer >w/c with Marie [name] for  LE strengthening and NMR. W/c propulsion to gym x150' with S. Sit <>stand 2 x 5 reps with min to modA +2. Pre-gait sit <>stand and alternating stepping in parallel bars with maxA, +2A for safety. Able to flex L hip/knee to clear L foot from ground, however poor placement of foot once returned to floor. Increased difficulty with R stepping due to LLE strength deficits and buckling in L knee.  Cues given at B glutes and quads for stance control. Standing frame x10 min, performed 1 set 10 reps mini-squats for glute/quad facilitation. Returned to room totalA for energy conservation. Squat pivot transfer w/c >bed minA for set-up of transfer board and verbal cues for technique. Sit >supine modA for LE management. Remained supine in bed at completion of session, all needs in reach.    Therapy Documentation Precautions:  Precautions Precautions: Back, Fall Precaution Booklet Issued: No Precaution Comments: instructed in back precautions Required Braces or Orthoses: Spinal Brace Spinal Brace: Thoracolumbosacral orthotic, Applied in sitting position Restrictions Weight Bearing Restrictions: No Pain: Pain Assessment Pain Assessment: No/denies pain Pain Score: 0-No pain   See Function Navigator for Current Functional Status.  Therapy/Group: Individual Therapy  Luberta Mutter 11/21/2015, 11:57 AM
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
## 2015-11-21 NOTE — Progress Notes (Signed)
Occupational Therapy Session Note  Patient Details  Name: Marie [name] MRN: [id number] Date of Birth: 11-Jun-1991  Today's Date: 11/21/2015 OT Individual Time: 1130-1200 OT Individual Time Calculation (min): 30 min    Short Term Goals: Week 2:  OT Short Term Goal 1 (Week 2): Pt will transfer to wide drop arm BSC with min assist OT Short Term Goal 2 (Week 2): Pt will tolerate sitting EOB for UB bathing/dressing without elevation of pain score > 2 points OT Short Term Goal 3 (Week 2): Pt will engage in simple meal prep with min assist w/c level OT Short Term Goal 4 (Week 2): Pt will be able to bridge hips with min A in bed to pull pants over hips  Skilled Therapeutic Interventions/Progress Updates:  Upon entering the room, pt seated in wheelchair awaiting therapist requesting to return to bed. Pt managing wheelchair and leg rests for set up of transfer. Pt placing board herself but needing assistance to lift L LE in order for her to place. Pt transferred on bed with steady assist for safety. Pt directing transfer in order to increase communication and safety during functional transfers. Pt able to verbalize steps for rolling L<> R in bed in order to protect back. Pt using momentum in UB to roll with min A in both directions without use of bed rail this session. Pt remained in bed with call bell and all needed items within reach upon exiting the room.   Therapy Documentation Precautions:  Precautions Precautions: Back, Fall Precaution Booklet Issued: No Precaution Comments: instructed in back precautions Required Braces or Orthoses: Spinal Brace Spinal Brace: Thoracolumbosacral orthotic, Applied in sitting position Restrictions Weight Bearing Restrictions: No   Pain: Pain Assessment Pain Assessment: No/denies pain Pain Score: 0-No pain ADL: ADL ADL Comments: refer to functional navigator  See Function Navigator for Current Functional Status.   Therapy/Group: Individual  Therapy  Lowella Grip 11/21/2015, 12:28 PM

## 2015-11-21 NOTE — Progress Notes (Signed)
Occupational Therapy Session Note  Patient Details  Name: Marie [name] MRN: [id number] Date of Birth: 30-Dec-1990  Today's Date: 11/21/2015 OT Individual Time: 0930-1100 OT Individual Time Calculation (min): 90 min    Short Term Goals: Week 2:  OT Short Term Goal 1 (Week 2): Pt will transfer to wide drop arm BSC with min assist OT Short Term Goal 2 (Week 2): Pt will tolerate sitting EOB for UB bathing/dressing without elevation of pain score > 2 points OT Short Term Goal 3 (Week 2): Pt will engage in simple meal prep with min assist w/c level OT Short Term Goal 4 (Week 2): Pt will be able to bridge hips with min A in bed to pull pants over hips  Skilled Therapeutic Interventions/Progress Updates:    Treatment session with focus on functional mobility, transfers, rolling, bridging, and standing tolerance.  Pt in bed upon arrival, reporting dressed and ready for session.  Completed transfers in ADL apt with slide board on/off standard bed with min assist and assist to setup slide board and reposition BLE.  Engaged in rolling at bed level without use of bed rails with min assist.  Attempted bridging in supine with support at feet and knees for stabilization.  Pt able to lift buttocks but not clear it.  Engaged in standing activity in Standing frame with focus on standing tolerance.  Pt able to initiate sit > stand with UE support on standing frame.  Engaged in mini squats in standing frame while engaging in table top activity.  Therapy Documentation Precautions:  Precautions Precautions: Back, Fall Precaution Booklet Issued: No Precaution Comments: instructed in back precautions Required Braces or Orthoses: Spinal Brace Spinal Brace: Thoracolumbosacral orthotic, Applied in sitting position Restrictions Weight Bearing Restrictions: No Pain: Pain Assessment Pain Assessment: 0-10 Pain Score: 6  Pain Location: Back Pain Orientation: Posterior Pain Descriptors / Indicators:  Aching Patients Stated Pain Goal: 3 Pain Intervention(s): Medication (See eMAR) ADL: ADL ADL Comments: refer to functional navigator  See Function Navigator for Current Functional Status.   Therapy/Group: Individual Therapy  Rosalio Loud 11/21/2015, 3:39 PM
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
## 2015-11-21 NOTE — Progress Notes (Signed)
Social Work Patient ID: Marie [name], female   DOB: 11/16/1990, 25 y.o.   MRN: [id number] Met with pt and spoke with Donna-soon to be mother in-law to discuss team conference goals-min assist wheelchair level and the progress she has made this week. Both are pleased with the progress, pt really wants to get independent from a wheelchair before she leaves here. Aware target discharge is 1/31. Butch Penny plans to be in tomorrow. Discussed getting wheelchair specialist in early next week to measure for chair and discuss options of what insurance will cover. Pt will continue to push herself in therapies, starting to work on  Bowel and bladder program. Butch Penny to bring in disability forms so we can complete together, also pursuing Workers Comp case.

## 2015-11-22 ENCOUNTER — Inpatient Hospital Stay (HOSPITAL_COMMUNITY): Payer: Worker's Compensation | Admitting: Physical Therapy

## 2015-11-22 ENCOUNTER — Inpatient Hospital Stay (HOSPITAL_COMMUNITY): Payer: Worker's Compensation | Admitting: Occupational Therapy
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
## 2015-11-22 NOTE — Progress Notes (Signed)
Orthopedic Tech Progress Note Patient Details:  Marie [name] 02-10-1991 [id number]  Ortho Devices Type of Ortho Device: Ankle Air splint Ortho Device/Splint Interventions: Application   Saul Fordyce 11/22/2015, 3:38 PM

## 2015-11-22 NOTE — Progress Notes (Signed)
Continent x 2 today requiring decreased I&O cath. Pt able to tell that she had to void, and notified staff appropriately.

## 2015-11-22 NOTE — Progress Notes (Signed)
Social Work Patient ID: Marie [name], female   DOB: Apr 21, 1991, 25 y.o.   MRN: [id number] [employer] has approved pt another week-until 1/24. Then will need another update then to continue pt's stay until target date of 1/31.

## 2015-11-22 NOTE — Progress Notes (Signed)
Occupational Therapy Session Note  Patient Details  Name: JALAYA SARVER MRN: [id number] Date of Birth: 28-May-1991  Today's Date: 11/22/2015 OT Individual Time: 0845-1000 and 1300-1415 OT Individual Time Calculation (min): 75 min and 75 min   Short Term Goals: Week 2:  OT Short Term Goal 1 (Week 2): Pt will transfer to wide drop arm BSC with min assist OT Short Term Goal 2 (Week 2): Pt will tolerate sitting EOB for UB bathing/dressing without elevation of pain score > 2 points OT Short Term Goal 3 (Week 2): Pt will engage in simple meal prep with min assist w/c level OT Short Term Goal 4 (Week 2): Pt will be able to bridge hips with min A in bed to pull pants over hips  Skilled Therapeutic Interventions/Progress Updates:    1) Treatment session with focus on functional mobility and self-care retraining.  Pt completed LB bathing and dressing at bed level with setup assist to provide clothing and use of reacher and long handled sponge.  Increased time to complete self-care, but pt with improved mobility and ability to utilize AE.  Pt continues to require assistance when bringing BLE over edge of bed in preparation for sidelying to sitting.  UB bathing and dressings completed at EOB with setup to don TLSO.  Discussed increasing challenge with LB bathing and dressing from EOB or even from [facility] to progress from bed level, with pt receptive of progression.  2) Treatment session with focus on home modifications, lateral leans, bed mobility, and scooting.  Pt propelled w/c to ADL kitchen, educated on modifications to kitchen setup to increase accessibility from w/c level.  Engaged in simulated meal prep with retrieving items from refrigerator and out of oven with education on w/c positioning to increase safety.  Pt reported need to toilet.  Therapist pushed w/c back to room for time and utilized Stedy onto [facility] due to urgency. Pt continent of urine on BSC, requiring assist for hygiene and clothing  management at sit > stand level with Stedy.  Returned to mat in therapy gym and engaged in lateral scoots and lateral leans to simulate LB clothing management with pt having difficulty with pulling pants further up over hips.  Engaged in bridging on mat with pt requiring assist to maintain BLE positioning and pt with increased lifting but still not clearing buttocks.  Left seated edge of mat awaiting PT.  Therapy Documentation Precautions:  Precautions Precautions: Back, Fall Precaution Booklet Issued: No Precaution Comments: instructed in back precautions Required Braces or Orthoses: Spinal Brace Spinal Brace: Thoracolumbosacral orthotic, Applied in sitting position Restrictions Weight Bearing Restrictions: No General:   Vital Signs: Therapy Vitals Temp: 97.8 F (36.6 C) Temp Source: Oral Pulse Rate: 76 Resp: 17 BP: (!) 115/56 mmHg Patient Position (if appropriate): Lying Oxygen Therapy SpO2: 99 % O2 Device: Not Delivered Pain: Pain Assessment Pain Score: 4  Pain Descriptors / Indicators: Dull ADL: ADL ADL Comments: refer to functional navigator  See Function Navigator for Current Functional Status.   Therapy/Group: Individual Therapy  Rosalio Loud 11/22/2015, 9:51 AM
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
## 2015-11-23 ENCOUNTER — Inpatient Hospital Stay (HOSPITAL_COMMUNITY): Payer: Worker's Compensation | Admitting: Physical Therapy

## 2015-11-23 MED ORDER — BETHANECHOL CHLORIDE 10 MG PO TABS
10.0000 mg | ORAL_TABLET | Freq: Three times a day (TID) | ORAL | Status: DC
  Administered 2015-11-23 – 2015-11-24 (×3): 10 mg via ORAL
  Filled 2015-11-23 (×3): qty 1

## 2015-11-23 NOTE — Progress Notes (Signed)
Occupational Therapy Note  Patient Details  Name: Marie [name] MRN: [id number] Date of Birth: 08-28-1991  Today's Date: 11/23/2015 OT Concurrent Time: 1445-1530 OT Concurrent Time Calculation (min): 45 min  Pt denied pain Concurrent Therapy  Pt resting in bed upon arrival and agreeable to therapy.  Pt performed supine->sit EOB with steady A to don TLSO.  Pt independent with directing care for donning TLSO and preparing for sliding board transfer to w/c.  Pt propelled to therapy gym and transferred to mat.  Pt educated on use of reacher and sock aid for doffing and donning socks.  Pt performed tasks with supervision.  Pt transferred to w/c and returned to room and bed to have her brief changed (pt incontinent of bowel). Pt remained in bed and NT notified.     Lavone Neri [facility] 11/23/2015, 3:51 PM
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
## 2015-11-24 LAB — URINALYSIS, ROUTINE W REFLEX MICROSCOPIC
BILIRUBIN URINE: NEGATIVE
Glucose, UA: NEGATIVE mg/dL
KETONES UR: NEGATIVE mg/dL
NITRITE: POSITIVE — AB
PROTEIN: NEGATIVE mg/dL
Specific Gravity, Urine: 1.02 (ref 1.005–1.030)
pH: 6 (ref 5.0–8.0)

## 2015-11-24 LAB — URINE MICROSCOPIC-ADD ON
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
## 2015-11-25 ENCOUNTER — Inpatient Hospital Stay (HOSPITAL_COMMUNITY): Payer: Worker's Compensation | Admitting: Occupational Therapy

## 2015-11-25 ENCOUNTER — Inpatient Hospital Stay (HOSPITAL_COMMUNITY): Payer: Worker's Compensation | Admitting: Physical Therapy

## 2015-11-25 NOTE — Progress Notes (Signed)
Occupational Therapy Session Note  Patient Details  Name: Marie [name] MRN: [id number] Date of Birth: 1991/06/24  Today's Date: 11/25/2015 OT Individual Time: 1030-1130 OT Individual Time Calculation (min): 60 min    Short Term Goals: Week 2:  OT Short Term Goal 1 (Week 2): Pt will transfer to wide drop arm BSC with min assist OT Short Term Goal 2 (Week 2): Pt will tolerate sitting EOB for UB bathing/dressing without elevation of pain score > 2 points OT Short Term Goal 3 (Week 2): Pt will engage in simple meal prep with min assist w/c level OT Short Term Goal 4 (Week 2): Pt will be able to bridge hips with min A in bed to pull pants over hips  Skilled Therapeutic Interventions/Progress Updates:    Pt seen for skilled OT to facilitate w/c to drop arm BSC transfers with slide board per pt request. Practiced transfers 3x back and forth making adjustments as needed to increase transfer ease for pt: lowered BSC one notch., added dycum under corner of the board and angled BSC perpendicular.  Pt did not need any physical assist, only to steady the board from sliding and set up to adjust board under her leg.  Pt then propelled chair to gym to work on UE AROM and resistance exercises she will be able to do at home. Returned to room at end of session with all needs met.  Therapy Documentation Precautions:  Precautions Precautions: Back, Fall Precaution Booklet Issued: No Precaution Comments: instructed in back precautions Required Braces or Orthoses: Spinal Brace Spinal Brace: Thoracolumbosacral orthotic, Applied in sitting position Restrictions Weight Bearing Restrictions: No      Pain: Pain Assessment Pain Assessment: 0-10 Pain Score: 6  Pain Type: Surgical pain Pain Location: Back Pain Orientation: Lower Pain Descriptors / Indicators: Aching;Sore Pain Onset: Gradual Patients Stated Pain Goal: 2 Pain Intervention(s): Medication (See eMAR);Rest Multiple Pain Sites:  No ADL: ADL ADL Comments: refer to functional navigator  See Function Navigator for Current Functional Status.   Therapy/Group: Individual Therapy  SAGUIER,JULIA 11/25/2015, 12:40 PM

## 2015-11-25 NOTE — Progress Notes (Signed)
Physical Therapy Session Note  Patient Details  Name: Marie [name] MRN: [id number] Date of Birth: [birth year]  Today's Date: 11/25/2015 PT Individual Time: 0900-0945 PT Individual Time Calculation (min): 45 min   Short Term Goals: Week 2:  PT Short Term Goal 1 (Week 2): Pt will perform supine>sit S and min verbal cues PT Short Term Goal 2 (Week 2): Pt will perform sit >supine minA PT Short Term Goal 3 (Week 2): Pt will perform lateral scoot transfer w/c <>bed set-up A PT Short Term Goal 4 (Week 2): Pt will perform static standing x2 min with LRAD PT Short Term Goal 5 (Week 2): Pt will perform ascent/descent of ramps, curbs with minA at w/c level  Skilled Therapeutic Interventions/Progress Updates:    Pt received seated in w/c, no c/o pain and agreeable to treatment. W/c propulsion to/from gym x150' with S for UE strengthening and endurance. In parallel bars performed sit <.stand with minA/min guard with UEs on bars x10 trials. Once standing, performed single LE hip flexion marching x10-15 reps on LLE, 3-4 reps on RLE due to LLE weakness in stance. Transfer w/c <> tilt table with hoyer due to height of table, total A +2. On tilt table, increased angle to 70 degrees x5 min, performing quad and glute sets. Angle reduced to 50  Degrees and performed 2 sets 5 reps mini-squats with strap across B tibias to prevent LE buckling. Required mod/max facilitation at L knee to prevent genu valgus and reduce pain. Returned to w/c and transported to room totalA for energy conservation. Remained seated in w/c at completion of session, all needs within reach.   Therapy Documentation Precautions:  Precautions Precautions: Back, Fall Precaution Booklet Issued: No Precaution Comments: instructed in back precautions Required Braces or Orthoses: Spinal Brace Spinal Brace: Thoracolumbosacral orthotic, Applied in sitting position Restrictions Weight Bearing Restrictions: No Pain: Pain Assessment Pain  Assessment: No/denies pain Pain Score: 0-No pain   See Function Navigator for Current Functional Status.   Therapy/Group: Individual Therapy  Vista Lawman 11/25/2015, 9:50 AM
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
## 2015-11-25 NOTE — Progress Notes (Signed)
Occupational Therapy Session Note  Patient Details  Name: Marie [name] MRN: [id number] Date of Birth: [birth year]  Today's Date: 11/25/2015 OT Individual Time: 8295-6213 OT Individual Time Calculation (min): 45 min    Short Term Goals: Week 2:  OT Short Term Goal 1 (Week 2): Pt will transfer to wide drop arm BSC with min assist OT Short Term Goal 2 (Week 2): Pt will tolerate sitting EOB for UB bathing/dressing without elevation of pain score > 2 points OT Short Term Goal 3 (Week 2): Pt will engage in simple meal prep with min assist w/c level OT Short Term Goal 4 (Week 2): Pt will be able to bridge hips with min A in bed to pull pants over hips  Skilled Therapeutic Interventions/Progress Updates:    Treatment session with focus on bed mobility, transfers, and increased independence and ability to direct care with self-care tasks.  Completed LB bathing and dressing at bed level with pt able to utilize bed settings to her advantage to increase independence and ability to bridge and reach feet with circle sitting.  Pt continues to require assist with washing buttocks secondary to back precautions.  UB bathing and dressing completed at EOB with setup assist and pt donning TLSO with ability to direct therapist with fit.  Slide board transfer to w/c with therapist assist to position slide board appropriately and remove post transfer, only providing steadying assist during transfer.  Pt motivated by her progress.  Therapy Documentation Precautions:  Precautions Precautions: Back, Fall Precaution Booklet Issued: No Precaution Comments: instructed in back precautions Required Braces or Orthoses: Spinal Brace Spinal Brace: Thoracolumbosacral orthotic, Applied in sitting position Restrictions Weight Bearing Restrictions: No General:   Vital Signs: Therapy Vitals Temp: 97.8 F (36.6 C) Temp Source: Oral Pulse Rate: 75 Resp: 16 BP: (!) 102/45 mmHg Patient Position (if appropriate):  Lying Oxygen Therapy SpO2: 96 % O2 Device: Not Delivered Pain:   Pt reports pain 5/10, dull ache in mid back.  RN notified ADL: ADL ADL Comments: refer to functional navigator  See Function Navigator for Current Functional Status.   Therapy/Group: Individual Therapy  Marie [name] 11/25/2015, 8:16 AM

## 2015-11-26 ENCOUNTER — Inpatient Hospital Stay (HOSPITAL_COMMUNITY): Payer: Worker's Compensation | Admitting: Occupational Therapy

## 2015-11-26 ENCOUNTER — Inpatient Hospital Stay (HOSPITAL_COMMUNITY): Payer: Worker's Compensation | Admitting: Physical Therapy

## 2015-11-26 ENCOUNTER — Inpatient Hospital Stay (HOSPITAL_COMMUNITY): Payer: Worker's Compensation

## 2015-11-26 LAB — URINE CULTURE: Culture: >=100000

## 2015-11-26 NOTE — Plan of Care (Signed)
Problem: RH PAIN MANAGEMENT Goal: RH STG PAIN MANAGED AT OR BELOW PT'S PAIN GOAL Pain less than 4  Outcome: Not Progressing Rates pain 6/10

## 2015-11-26 NOTE — Progress Notes (Signed)
Social Work Patient ID: Marie [name], female   DOB: 08/07/1991, 26 y.o.   MRN: [id number] Spoke with Marie [name] pt's mother in-law who is working on pt's disability and has a telephone interview on 1/31 @ 9:00 am in her hospital room. She is also moving furniture to make room for a hospital bed and pt's equipment. Will continue to work on discharge plans and see Marie [name] when she is able to visit it is an hour away and her husband is ill also.  Will need to have her come in for family education prior to pt's discharge.
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
## 2015-11-26 NOTE — Progress Notes (Signed)
Occupational Therapy Session Note  Patient Details  Name: Marie [name] MRN: [id number] Date of Birth: 06-13-1991  Today's Date: 11/26/2015 OT Individual Time: 4098-1191 OT Individual Time Calculation (min): 72 min    Short Term Goals: Week 2:  OT Short Term Goal 1 (Week 2): Pt will transfer to wide drop arm BSC with min assist OT Short Term Goal 2 (Week 2): Pt will tolerate sitting EOB for UB bathing/dressing without elevation of pain score > 2 points OT Short Term Goal 3 (Week 2): Pt will engage in simple meal prep with min assist w/c level OT Short Term Goal 4 (Week 2): Pt will be able to bridge hips with min A in bed to pull pants over hips  Skilled Therapeutic Interventions/Progress Updates:    Treatment session with focus on functional transfers, lateral leans for LB dressing, and education on use of AE.  Engaged in bathing and dressing seated at EOB, with pt able to utilize reacher, long handled sponge, and sock aid to complete LB bathing and dressing.  Engaged in lateral leans to pull pants over hips, with pt unable to completely pull pants over hips and therapist unable to assist in current position at EOB.  Utilized Stedy for sit > stand to pull pants over hips, with pt able to maintain standing balance with one UE support while utilizing other to pull pants up (Stedy blocking knees in upright position).  Slide board transfer to [facility] with use of dicem on BSC to maintain non slip surface.  Pt able to pull pants over hips with lateral leans and boosting in preparation to toilet.  Unable to pull pants back over hips with lateral leans, again utilized Stedy for standing to pull up pants.  Completed oral hygiene in partial standing position in Stedy to increase BLE strengthening.  Therapy Documentation Precautions:  Precautions Precautions: Back, Fall Precaution Booklet Issued: No Precaution Comments: instructed in back precautions Required Braces or Orthoses: Spinal Brace Spinal  Brace: Thoracolumbosacral orthotic, Applied in sitting position Restrictions Weight Bearing Restrictions: No Pain: Pain Assessment Pain Score: 5  Pain Location: Back Pain Intervention(s): Medication (See eMAR) ADL: ADL ADL Comments: refer to functional navigator  See Function Navigator for Current Functional Status.   Therapy/Group: Individual Therapy  Rosalio Loud 11/26/2015, 11:39 AM

## 2015-11-26 NOTE — Progress Notes (Signed)
Occupational Therapy Session Note  Patient Details  Name: Marie [name] MRN: [id number] Date of Birth: [birth year]  Today's Date: 11/26/2015 OT Individual Time: 1300-1347 OT Individual Time Calculation (min): 47 min    Short Term Goals: Week 1:  OT Short Term Goal 1 (Week 1): Pt will transfer to wide drop arm BSC with max A x1. OT Short Term Goal 1 - Progress (Week 1): Met OT Short Term Goal 2 (Week 1): Pt will be able to roll in bed with min A to assist caregivers with LB self care. OT Short Term Goal 2 - Progress (Week 1): Met OT Short Term Goal 3 (Week 1): Pt will be able to use reacher to don pants over feet with min A. OT Short Term Goal 3 - Progress (Week 1): Met OT Short Term Goal 4 (Week 1): Pt will be able to bridge hips with min A in bed to pull pants over hips. OT Short Term Goal 4 - Progress (Week 1): Progressing toward goal Week 2:  OT Short Term Goal 1 (Week 2): Pt will transfer to wide drop arm BSC with min assist OT Short Term Goal 2 (Week 2): Pt will tolerate sitting EOB for UB bathing/dressing without elevation of pain score > 2 points OT Short Term Goal 3 (Week 2): Pt will engage in simple meal prep with min assist w/c level OT Short Term Goal 4 (Week 2): Pt will be able to bridge hips with min A in bed to pull pants over hips      Skilled Therapeutic Interventions/Progress Updates:    Pt seen for skilled OT to facilitate functional mobility skills needed for toileting skills. Pt expressed desire to work on sit to stand and standing as lateral leans are difficult for her to manage clothing over hips. A rehab tech assisted this therapist with helping pt with sit to stand to RW. Pt needs quite a bit of facilitation to push weight toward her L side as she leans toward her dominant leg.  Pt worked on this 3x, standing for just a few seconds at a time and then repeated same technique to stand to parallel bar 3x. Pt became quite fatigued at this point. Used slide board to mat  to work on lateral leans. Pt able to lean all the way onto each elbow maintaining back prec.  Demonstrated set up of drop arm next to bed and w/c on other side so she can drop both arms on BSC to allow a full lateral lean. Pt desired to return to bed for short rest. Used board to get into bed, assist legs up into bed. Pt with all needs met.  Therapy Documentation Precautions:  Precautions Precautions: Back, Fall Precaution Booklet Issued: No Precaution Comments: instructed in back precautions Required Braces or Orthoses: Spinal Brace Spinal Brace: Thoracolumbosacral orthotic, Applied in sitting position Restrictions Weight Bearing Restrictions: No     Pain: Pain Assessment Pain Assessment: 0-10 Pain Score: 3  Pain Type: Surgical pain Pain Location: Back Pain Orientation: Lower Pain Descriptors / Indicators: Aching;Sore Pain Intervention(s): Rest ADL: ADL ADL Comments: refer to functional navigator  See Function Navigator for Current Functional Status.   Therapy/Group: Individual Therapy  SAGUIER,JULIA 11/26/2015, 2:22 PM

## 2015-11-26 NOTE — Progress Notes (Signed)
Physical Therapy Session Note  Patient Details  Name: Marie [name] MRN: [id number] Date of Birth: February 10, 1991  Today's Date: 11/26/2015 PT Individual Time: 1430-1530 PT Individual Time Calculation (min): 60 min   Short Term Goals: Week 2:  PT Short Term Goal 1 (Week 2): Pt will perform supine>sit S and min verbal cues PT Short Term Goal 2 (Week 2): Pt will perform sit >supine minA PT Short Term Goal 3 (Week 2): Pt will perform lateral scoot transfer w/c <>bed set-up A PT Short Term Goal 4 (Week 2): Pt will perform static standing x2 min with LRAD PT Short Term Goal 5 (Week 2): Pt will perform ascent/descent of ramps, curbs with minA at w/c level  Skilled Therapeutic Interventions/Progress Updates:    Pt received supine in bed with handoff from OT after previous session; no c/o pain and agreeable to treatment. Supine>sit with S. Transfer bed<>w/c at beginning/end of session with S. Bed mobility supine<>sit on mat table with leg loops x4 trials with modA initially, cues for problem solving, technique. By final trial, required minA sit >supine for BLE management. Scooting R/L in bed with BLE bridging with S to improve bed mobility, LE strength. Minimal excursion and pt unable to clear hips from table for efficient scooting. Sit>stand with minA from edge of mat table with UEs on parallel bars. Pt reports extreme fatigue and unable to attempt further standing. W/c mobility 2 x 175' with mod I to and from room. Returned to bed as described above. Rolling R/L with S and bedrails to doff TLSO. Noted incision draining through bandage; RN alerted. Pt remained supine in bed at completion of session all needs within reach.   Therapy Documentation Precautions:  Precautions Precautions: Back, Fall Precaution Booklet Issued: No Precaution Comments: instructed in back precautions Required Braces or Orthoses: Spinal Brace Spinal Brace: Thoracolumbosacral orthotic, Applied in sitting  position Restrictions Weight Bearing Restrictions: No Pain: Pain Assessment Pain Assessment: No/denies pain Pain Score: 0-No pain Pain Type: Surgical pain Pain Location: Back Pain Orientation: Lower Pain Descriptors / Indicators: Aching;Sore Pain Intervention(s): Rest   See Function Navigator for Current Functional Status.   Therapy/Group: Individual Therapy  Vista Lawman 11/26/2015, 3:41 PM

## 2015-11-26 NOTE — Progress Notes (Signed)
Occupational Therapy Session Note  Patient Details  Name: Marie [name] MRN: [id number] Date of Birth: 1991/05/12  Today's Date: 11/26/2015 OT Individual Time: 1400-1430 OT Individual Time Calculation (min): 30 min    Short Term Goals: Week 2:  OT Short Term Goal 1 (Week 2): Pt will transfer to wide drop arm BSC with min assist OT Short Term Goal 2 (Week 2): Pt will tolerate sitting EOB for UB bathing/dressing without elevation of pain score > 2 points OT Short Term Goal 3 (Week 2): Pt will engage in simple meal prep with min assist w/c level OT Short Term Goal 4 (Week 2): Pt will be able to bridge hips with min A in bed to pull pants over hips  Skilled Therapeutic Interventions/Progress Updates: ADL-retraining with focus on seated clothing management during toileting, problem-solving, and donning/doffing leg loops.   Pt received supine in bed w/o TLSO off.   Pt uses electric hospital bed effectively with standby assist to rise to sit at EOB.   Pt dons TLSO with min assist to adjust left side straps.   Pt completes slide board transfer to [facility] and simulates toileting clothing management.   Pt educated on use of furniture/w/c and fixtures (sink) to enhance lateral leans while lifting pants up over hips.   Pt advised to adjust BSC at home to provide contact with heels, using weight-bearing with opposite UE through same LE.   Pt dons pants with min assist (pt = 90%).   Pt recovers to bed and is instructed on preferred method to don leg loops (circle sitting) however she is able to don loops while supine using HOB elevation feature and manual manipulation of each leg to attach straps to her ankles.   Pt received by physical therapist at end of session.     Therapy Documentation Precautions:  Precautions Precautions: Back, Fall Precaution Booklet Issued: No Precaution Comments: instructed in back precautions Required Braces or Orthoses: Spinal Brace Spinal Brace: Thoracolumbosacral orthotic,  Applied in sitting position Restrictions Weight Bearing Restrictions: No General:   Vital Signs:  Pain: Pain Assessment Pain Assessment: 0-10 Pain Score: 3  Pain Type: Surgical pain Pain Location: Back Pain Orientation: Lower Pain Descriptors / Indicators: Aching;Sore Pain Intervention(s): Rest ADL: ADL ADL Comments: refer to functional navigator Exercises:   Other Treatments:    See Function Navigator for Current Functional Status.   Therapy/Group: Individual Therapy  Juliannah Ohmann 11/26/2015, 2:38 PM

## 2015-11-27 ENCOUNTER — Inpatient Hospital Stay (HOSPITAL_COMMUNITY): Payer: Worker's Compensation | Admitting: Physical Therapy

## 2015-11-27 ENCOUNTER — Inpatient Hospital Stay (HOSPITAL_COMMUNITY): Payer: Worker's Compensation | Admitting: Occupational Therapy

## 2015-11-27 MED ORDER — CEPHALEXIN 250 MG PO CAPS
250.0000 mg | ORAL_CAPSULE | Freq: Three times a day (TID) | ORAL | Status: DC
Start: 2015-11-27 — End: 2015-12-03
  Administered 2015-11-27 – 2015-12-03 (×19): 250 mg via ORAL
  Filled 2015-11-27 (×19): qty 1

## 2015-11-27 NOTE — Progress Notes (Signed)
Physical Therapy Session Note  Patient Details  Name: Marie [name] MRN: [id number] Date of Birth: [birth year]  Today's Date: 11/27/2015 PT Individual Time: 1400-1525 PT Individual Time Calculation (min): 85 min   Short Term Goals: Week 2:  PT Short Term Goal 1 (Week 2): Pt will perform supine>sit S and min verbal cues PT Short Term Goal 2 (Week 2): Pt will perform sit >supine minA PT Short Term Goal 3 (Week 2): Pt will perform lateral scoot transfer w/c <>bed set-up A PT Short Term Goal 4 (Week 2): Pt will perform static standing x2 min with LRAD PT Short Term Goal 5 (Week 2): Pt will perform ascent/descent of ramps, curbs with minA at w/c level  Skilled Therapeutic Interventions/Progress Updates:    Pt received seated on mat table with handoff from OT after previous session; c/o back pain as described below and agreeable to treatment. Erik from [employer] present for w/c evaluation. Initial measurements taken while seated on mat table with mod I for seated balance. Transfer mat>w/c setupA. W/c propulsion during session 3 x 175' with mod I on level surfaces. Sit <>stand in [place] minA for transfer onto tilt table; required +2A to transfer standing in St[place] to tilt table due to height of table. Sit <>supine minA on tilt table. At 45-50 degrees, 3 x 5-6 min each trial while performing quad sets, mini-squats with strap across B tibias to prevent buckling. Requires rest break in supine between each trial due to back discomfort which improves with rest. Aircast donned to LLE after first standing trial due to onset of L ankle pain with prolonged standing, with aircast providing medial/lateral support. Pt reports urinary urgency; transferred tilt table >w/c with maxi-move totalA. With RN, +2 A and use of stedy for transfer w/c <>BSC due to urgency. Clothing management and hygiene performed totalA again due to urgency. Pt returned to day room with mod I w/c propulsion to complete w/c evaluation. Pt educated  in process for ordering w/c, type of chair, cushion, back, leg rests, and purpose of all the above for improved independence, reducing risk of overuse injury, skin protection and postural support. Transfer w/c >bed setupA. Sit >supine minA for LE management using leg loops. Remained supine in bed at completion of session with all needs in reach.   Therapy Documentation Precautions:  Precautions Precautions: Back, Fall Precaution Booklet Issued: No Precaution Comments: instructed in back precautions Required Braces or Orthoses: Spinal Brace Spinal Brace: Thoracolumbosacral orthotic, Applied in sitting position Restrictions Weight Bearing Restrictions: No Pain: Pain Assessment Pain Assessment: 0-10 Pain Score: 5  Pain Type: Surgical pain Pain Location: Back Pain Orientation: Mid Pain Descriptors / Indicators: Aching Pain Onset: On-going Patients Stated Pain Goal: 2 Pain Intervention(s): Other (Comment) (pre-medicated)   See Function Navigator for Current Functional Status.   Therapy/Group: Individual Therapy  Vista Lawman 11/27/2015, 4:11 PM
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
## 2015-11-27 NOTE — Plan of Care (Signed)
Problem: RH Simple Meal Prep Goal: LTG Patient will perform simple meal prep w/assist (OT) LTG: Patient will perform simple meal prep with assistance, with/without cues (OT).  Upgraded due to progress  Problem: RH Toilet Transfers Goal: LTG Patient will perform toilet transfers w/assist (OT) LTG: Patient will perform toilet transfers with assist, with/without cues using equipment (OT)  Upgraded due to progress  Problem: RH Tub/Shower Transfers Goal: LTG Patient will perform tub/shower transfers w/assist (OT) LTG: Patient will perform tub/shower transfers with assist, with/without cues using equipment (OT)  Outcome: Not Applicable Date Met:  09/31/12 D/C due to medical issues, not cleared for shower

## 2015-11-27 NOTE — Progress Notes (Signed)
Occupational Therapy Weekly Progress Note  Patient Details  Name: Marie [name] MRN: [id number] Date of Birth: 12/11/1990  Beginning of progress report period: November 20, 2015 End of progress report period: November 27, 2015  Today's Date: 11/27/2015 OT Individual Time: 0835-1000 and 1300-1400 OT Individual Time Calculation (min): 85 min and 60 min   Patient has met 4 of 4 short term goals.  Pt is making steady progress towards goals.  Pt currently min assist bathing at bed level, due to decreased ability to wash buttocks secondary to back precautions and body habitus, otherwise pt setup for bathing and dressing tasks at bed level with use of AE.  Have progressed to engaging in bathing and dressing seated EOB or on BSC to increase sitting balance and tolerance as well as increase challenge with utilizing lateral leans for LB dressing.  Pt with difficulty with lateral leans secondary to impaired sensation and strength.  Requires +2 for safety with sit > stand and LB dressing tasks in standing.  Pt is able to complete lateral transfers with slide board with setup assist to position slide board.  Patient continues to demonstrate the following deficits: impaired LLE sensation and strength, decreased endurance, back pain and precautions and therefore will continue to benefit from skilled OT intervention to enhance overall performance with BADL and Reduce care partner burden.  Patient progressing toward long term goals..  Plan of care revisions: upgraded toilet transfers and simple meal prep goals to supervision/setup.  OT Short Term Goals Week 2:  OT Short Term Goal 1 (Week 2): Pt will transfer to wide drop arm BSC with min assist OT Short Term Goal 1 - Progress (Week 2): Met OT Short Term Goal 2 (Week 2): Pt will tolerate sitting EOB for UB bathing/dressing without elevation of pain score > 2 points OT Short Term Goal 2 - Progress (Week 2): Met OT Short Term Goal 3 (Week 2): Pt will engage in  simple meal prep with min assist w/c level OT Short Term Goal 3 - Progress (Week 2): Met OT Short Term Goal 4 (Week 2): Pt will be able to bridge hips with min A in bed to pull pants over hips OT Short Term Goal 4 - Progress (Week 2): Met Week 3:  OT Short Term Goal 1 (Week 3): STG = LTGs due to remaining LOS  Skilled Therapeutic Interventions/Progress Updates:    1) Treatment session with focus on functional transfers, lateral leans, and sit > stand during self-care tasks.  Upon arrival, pt reports needing to toilet.  Performed slide board transfer to BSC with min assist secondary to not wearing pants and pt requiring increased lift off to not stick to slide board (utilized bed pad to reduce friction).  Pt continent of urine, able to pull pants down prior to toileting with lateral leans and ability to complete hygiene by reaching infront.  Utilized RW to pull up in to standing with therapist blocking Lt knee due to instability and 2nd person on pt's Rt for safety and stability in standing.  Pt attempted to pull pants over hips but required BUE support on RW to maintain upright standing.  Pt continues to demonstrate good carryover with use of AE to complete LB bathing and dressing tasks with setup.  Engaged in sit > stand x4 to increase standing tolerance, pt continue to be reliant on UE support.  Returned to bed at end of session, discussed progress towards goals and varying abilities with self-care depending on bed level   vs sit > stand.  Pt pleased with current progress.  2) Treatment session with focus on transfers and lateral leans.  Re-educated on slide board placement to promote carryover and ability to direct her care.  Pt completed transfer to w/c with setup assist only this session to position w/c and slide board.  Engaged in simulated LB dressing with theraband to continue to address lateral leans and sitting balance.  Pt progressed to ability to pull theraband over hips with supervision only,  requiring multiple leans.  Plan to further address during next bathing and dressing session.  Engaged in [place] activity with 3# dowel with chest presses, rows, and overhead presses.  Pt passed off to PT at end of session.  Therapy Documentation Precautions:  Precautions Precautions: Back, Fall Precaution Booklet Issued: No Precaution Comments: instructed in back precautions Required Braces or Orthoses: Spinal Brace Spinal Brace: Thoracolumbosacral orthotic, Applied in sitting position Restrictions Weight Bearing Restrictions: No Pain:  Pt reports pain 4/10 in back, premedicated ADL: ADL ADL Comments: refer to functional navigator  See Function Navigator for Current Functional Status.   Therapy/Group: Individual Therapy  Simonne Come 11/27/2015, 2:48 PM

## 2015-11-28 ENCOUNTER — Inpatient Hospital Stay (HOSPITAL_COMMUNITY): Payer: Worker's Compensation | Admitting: Occupational Therapy

## 2015-11-28 ENCOUNTER — Inpatient Hospital Stay (HOSPITAL_COMMUNITY): Payer: PRIVATE HEALTH INSURANCE | Admitting: Physical Therapy
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
## 2015-11-28 NOTE — Progress Notes (Signed)
Physical Therapy Weekly Progress Note  Patient Details  Name: Marie [name] MRN: [id number] Date of Birth: May 19, 1991  Beginning of progress report period: November 21, 2015 End of progress report period: November 28, 2015   Patient has met 3 of 5 short term goals.  Unmet goals include standing tolerance of 2 min due to continued strength deficits in BLEs and impaired endurance; pt able to tolerate approximately 45 sec-1 min at a time before requiring seated rest break. W/c goal also unmet due to decreased focus on mobility in recent sessions. Treatment sessions focusing on bed mobility with leg loops to decreased caregiver burden, transfers with set-up A. W/c assessment performed this week to pt longterm use of ultra lightweight chair for increased independence and decreased caregiver burden. Family education scheduled to be performed with mother-in-law prior to d/c.   Patient continues to demonstrate the following deficits: impaired  activity tolerance, balance, postural control, ability to compensate for deficits and functional use of  right lower extremity and left lower extremity and therefore will continue to benefit from skilled PT intervention to enhance overall performance with bed mobility, transfers, w/c propulsion, sit <>stand for functional adls, home and community access.  Patient progressing toward long term goals..  Continue plan of care.  PT Short Term Goals Week 2:  PT Short Term Goal 1 (Week 2): Pt will perform supine>sit S and min verbal cues PT Short Term Goal 1 - Progress (Week 2): Met PT Short Term Goal 2 (Week 2): Pt will perform sit >supine minA PT Short Term Goal 2 - Progress (Week 2): Met PT Short Term Goal 3 (Week 2): Pt will perform lateral scoot transfer w/c <>bed set-up A PT Short Term Goal 3 - Progress (Week 2): Met PT Short Term Goal 4 (Week 2): Pt will perform static standing x2 min with LRAD PT Short Term Goal 4 - Progress (Week 2): Not met PT Short Term  Goal 5 (Week 2): Pt will perform ascent/descent of ramps, curbs with minA at w/c level PT Short Term Goal 5 - Progress (Week 2): Not met Week 3:  PT Short Term Goal 1 (Week 3): =LTG due to estimated length of stay   See Function Navigator for Current Functional Status.   Marie [name] 11/28/2015, 7:13 AM
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
## 2015-11-28 NOTE — Progress Notes (Signed)
Occupational Therapy Session Note  Patient Details  Name: Marie [name] MRN: [id number] Date of Birth: [birth year]  Today's Date: 11/28/2015 OT Individual Time: 4098-1191 and 1100-1200 OT Individual Time Calculation (min): 60 min and 60 min   Short Term Goals: Week 3:  OT Short Term Goal 1 (Week 3): STG = LTGs due to remaining LOS  Skilled Therapeutic Interventions/Progress Updates:    1) Treatment session with focus on LB self-care tasks seated at EOB.  Pt completed bathing and dressing seated EOB with increased focus on use of AE and lateral leans for LB dressing.  Pt able to complete all bathing and dressing seated EOB, completing multiple back and forth lateral leans to pull pants mostly over hips.  Pt with difficulty getting pants full up in back, engaged in mini-squat/push up from EOB with pt able to lift buttocks with therapist blocked her knees and pulled pants up in back.  Transfer OOB with use of slide board with setup assist for proper positioning of board while pt lifted leg.  Grooming and self-feeding completed seated up in w/c without assist.  2) Treatment session with focus on activity tolerance, sit > stand, and standing tolerance.  Engaged in sit > stand at high low table with pt able to push up from table surface into standing with min assist and blocking at Lt knee with 2nd person only for safety, no hands on.  Engaged in card activity at sit > stand level with pt able to stand to draw card and discard 50% of time, standing between 30-45 seconds with min assist before requiring seated rest break.  Continued standing activity in standing frame due to no +2 for safety with pt able to maintain standing for 12 mins with no issues.  Engaged in mini squats in standing frame between turns with card activity.  Pt transferred back to bed and end of session with slide board and assist to bring both legs into bed.  Therapy Documentation Precautions:  Precautions Precautions: Back,  Fall Precaution Booklet Issued: No Precaution Comments: instructed in back precautions Required Braces or Orthoses: Spinal Brace Spinal Brace: Thoracolumbosacral orthotic, Applied in sitting position Restrictions Weight Bearing Restrictions: No General:   Vital Signs: Therapy Vitals Pulse Rate: 79 BP: 121/68 mmHg Patient Position (if appropriate): Sitting Pain: Pain Assessment Pain Assessment: 0-10 Pain Score: 7  Pain Location: Back Pain Orientation: Mid Pain Descriptors / Indicators: Aching Pain Intervention(s): Medication (See eMAR) ADL: ADL ADL Comments: refer to functional navigator  See Function Navigator for Current Functional Status.   Therapy/Group: Individual Therapy  Rosalio Loud 11/28/2015, 9:34 AM

## 2015-11-28 NOTE — Plan of Care (Signed)
Problem: RH PAIN MANAGEMENT Goal: RH STG PAIN MANAGED AT OR BELOW PT'S PAIN GOAL Pain less than 4  Outcome: Not Progressing Rates pain at 7/10
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
## 2015-11-29 ENCOUNTER — Inpatient Hospital Stay (HOSPITAL_COMMUNITY): Payer: Worker's Compensation | Admitting: Physical Therapy

## 2015-11-29 ENCOUNTER — Inpatient Hospital Stay (HOSPITAL_COMMUNITY): Payer: Worker's Compensation | Admitting: Occupational Therapy

## 2015-11-29 MED ORDER — OXYBUTYNIN CHLORIDE 5 MG PO TABS
2.5000 mg | ORAL_TABLET | Freq: Every day | ORAL | Status: DC
  Administered 2015-11-29 – 2015-12-02 (×4): 2.5 mg via ORAL
  Filled 2015-11-29 (×4): qty 1

## 2015-11-29 NOTE — Progress Notes (Signed)
Social Work Patient ID: Marie [name], female   DOB: 07-19-1991, 25 y.o.   MRN: [id number] Trying to get a hold of [name] to schedule family education on Monday. 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 Have ordered hospital bed, wide drop-arm bedside commode and 24 transfer board from [facility] to be delivered to the home on Monday. [employer] providing the loaner  Wheelchair until her's is in. Pt walked in the parallel bars which really encouraged her being able to put weight on her legs and they supporting her.

## 2015-11-29 NOTE — Progress Notes (Signed)
Social Work Patient ID: Marie [name], female   DOB: 08-29-1991, 25 y.o.   MRN: [id number] Pt aware have yet to contact [name] she hopes to talk with her this weekend and set up the times she needs to be here on Monday. Have left another message and will see if here on Monday, aware pt's discharge is Tuesday. Equipment ordered and set to deliver Monday to home. Home health arranged via [facility]. See early Monday am.
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
## 2015-11-29 NOTE — Progress Notes (Signed)
Physical Therapy Session Note  Patient Details  Name: Marie [name] MRN: [id number] Date of Birth: 04/20/1991  Today's Date: 11/29/2015 PT Individual Time: 1000-1100 and 1415-1500  PT Individual Time Calculation (min): 60 min and 45 min (total 105 min)    Short Term Goals: Week 3:  PT Short Term Goal 1 (Week 3): =LTG due to estimated length of stay  Skilled Therapeutic Interventions/Progress Updates:    Tx 1: Pt received supine in bed, no c/o pain and agreeable to treatment. Supine>sit with log roll technique with S and bedrails. Donned TLSO with maxA. Lateral scoot to/from bed at beginning/end of session with set-upA. W/c propulsion to gym mod I. Gait in parallel bars 8x7' for total of 56', limited in distance per trial due to length of bars. Improving stance control in BLEs, still requires guarding and occasional facilitation at L knee for stance control with increased weakness compared to R. Sit <>stand x3 trials in bars with one UE reaching to facilitate LE weight bearing and stance control with reduced UE support. Performed x5 trials with cues for removal of BUEs; pt demonstrates parastance with reliance on anterior hip ligaments due to weakness in B glutes as well as decreased core stability/control with LOB anteriorly when attempting to correct to midline. Returned to room totalA for energy conservation. Transfer to bed as described above. Sit >supine with leg loops and minA. Rolling R/L to doff TLSO. Remained supine with all needs in reach at completion of session.   Tx 2: Pt received seated in w/c, no c/o pain and agreeable to treatment. W/c propulsion to gym with mod I x175'. Sit <>stand x7 trials with standing approximately 45 sec-1 min each trial while playing cards; minA for sit <>stand while using UEs on RW. Cues for LLE weight bearing, stance control with pt shifting weight onto stronger RLE. Pt given shoulder girdle strengthening exercise packet for shoulder preservation with  expected long-term w/c propulsion. Standing frame 2x4 min while performing mini-squats and heel raises; unable to raise LLE heel and minimal movement on RLE. Returned to room and transferred w/c >bed set-upA. Sit>supine minA for LE management. Remained supine in bed at completion of session, all needs within reach.   Therapy Documentation Precautions:  Precautions Precautions: Back, Fall Precaution Booklet Issued: No Precaution Comments: instructed in back precautions Required Braces or Orthoses: Spinal Brace Spinal Brace: Thoracolumbosacral orthotic, Applied in sitting position Restrictions Weight Bearing Restrictions: No Pain: Pain Assessment Pain Assessment: No/denies pain Pain Score: 0-No pain Pain Type: Surgical pain Pain Location: Back Pain Descriptors / Indicators: Aching Pain Onset: Gradual Patients Stated Pain Goal: 2 Pain Intervention(s): Medication (See eMAR) Multiple Pain Sites: No   See Function Navigator for Current Functional Status.   Therapy/Group: Individual Therapy  Vista Lawman 11/29/2015, 12:15 PM

## 2015-11-29 NOTE — Progress Notes (Signed)
Occupational Therapy Session Note  Patient Details  Name: Marie [name] MRN: [id number] Date of Birth: 10-21-1991  Today's Date: 11/29/2015 OT Individual Time: 7829-5621 and 1300-1400 OT Individual Time Calculation (min): 75 min and 60 min   Short Term Goals: Week 3:  OT Short Term Goal 1 (Week 3): STG = LTGs due to remaining LOS  Skilled Therapeutic Interventions/Progress Updates:    1) Treatment session with focus on functional transfers, sit > stand, and increased challenge with self-care tasks.  Engaged in bathing at shower level.  Pt donned pants prior to shower in bed to decrease friction on buttocks with slide board transfer into shower.  Performed transfer w/c > tub bench in walk-in shower with setup assist only.  Bathing completed seated with lateral leans.  Post shower, donned shirt and TLSO in shower then completed partial stand for therapist to dry buttocks and pull pants over hips prior to slide board back to w/c.  Attempted lateral scoot without slide board with pt reporting decreased confidence, therefore utilized slide board.  Returned to bed and donned incontinence brief and pants at bed level with setup assist.    2) Treatment session with focus on sit > stand and dynamic standing balance.  Pt completed transfer bed > w/c with slide board without setup, just close supervision.  Engaged in sit > stand from therapy mat with pt pushing up from RW, progressing to lifting one UE from RW for ~5 seconds.  Increased challenge to having pt pull theraband up over hips to simulate LB dressing with pt requiring assist to maintain stability of RW with sit > stand and min assist to maintain standing balance, progressing to close supervision and stabilization of RW while pt pulled "pants" over hips on Rt side then able to pull over Lt hip in sitting as pt reluctant to release LUE from RW.  Engaged in reaching behind buttocks in standing to promote trunk control and further increase ability to  complete LB hygiene and dressing.  Progressed to sit > stand with one hand on RW and pushing up from mat with RUE on yoga block on mat for leverage, pt requiring min assist.  Therapy Documentation Precautions:  Precautions Precautions: Back, Fall Precaution Booklet Issued: No Precaution Comments: instructed in back precautions Required Braces or Orthoses: Spinal Brace Spinal Brace: Thoracolumbosacral orthotic, Applied in sitting position Restrictions Weight Bearing Restrictions: No General:   Vital Signs: Therapy Vitals Temp: 98.2 F (36.8 C) Temp Source: Oral Pulse Rate: 68 Resp: 18 BP: 124/74 mmHg Patient Position (if appropriate): Lying Oxygen Therapy SpO2: 99 % O2 Device: Not Delivered Pain:   Reports pain 4/10 in lower back, premedicated ADL: ADL ADL Comments: refer to functional navigator  See Function Navigator for Current Functional Status.   Therapy/Group: Individual Therapy  Rosalio Loud 11/29/2015, 8:33 AM
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
## 2015-12-02 ENCOUNTER — Inpatient Hospital Stay (HOSPITAL_COMMUNITY): Payer: Worker's Compensation | Admitting: Physical Therapy

## 2015-12-02 ENCOUNTER — Inpatient Hospital Stay (HOSPITAL_COMMUNITY): Payer: Worker's Compensation | Admitting: Occupational Therapy

## 2015-12-02 NOTE — Discharge Summary (Signed)
Discharge summary job [id number]

## 2015-12-02 NOTE — Progress Notes (Signed)
Social Work Patient ID: Marie [name], female   DOB: 1991-08-08, 25 y.o.   MRN: [id number] Met with pt just missed Marie [name]-mother in-law who was here for education/training. Pt reports the equipment was delivered to home today. She has her loaner wheelchair and is ready for discharge tomorrow. [facility] phone interview tomorrow at 9:00 in her room. Will sit in with her to assist  If needed. Home Health arranged via [employer] will see in am tomorrow.

## 2015-12-02 NOTE — Progress Notes (Signed)
Occupational Therapy Discharge Summary  Patient Details  Name: Marie [name] MRN: [id number] Date of Birth: 09-13-1991  Patient has met 7 of 7 long term goals due to improved activity tolerance, improved balance, postural control and ability to compensate for deficits.  Patient to discharge at [facility] Assist level with LB self-care tasks and toileting hygiene, Supervision with transfers.  Patient's care partner is independent to provide the necessary physical assistance at discharge.    Reasons goals not met: N/A  Recommendation:  Patient will benefit from ongoing skilled OT services in home health setting to continue to advance functional skills in the area of BADL and Reduce care partner burden.  Equipment: wide drop arm BSC and hospital bed  Reasons for discharge: treatment goals met and discharge from hospital  Patient/family agrees with progress made and goals achieved: Yes  OT Discharge Precautions/Restrictions  Precautions Precautions: Back;Fall Precaution Comments: instructed in back precautions Required Braces or Orthoses: Spinal Brace Spinal Brace: Thoracolumbosacral orthotic;Applied in sitting position Restrictions Weight Bearing Restrictions: No Pain Pain Assessment Pain Assessment: No/denies pain Pain Score: 0-No pain ADL ADL ADL Comments: refer to functional navigator Vision/Perception  Vision- History Baseline Vision/History: Wears glasses (or contacts) Wears Glasses: At all times Patient Visual Report: No change from baseline Vision- Assessment Vision Assessment?: No apparent visual deficits Perception Comments: WFL  Cognition Overall Cognitive Status: Within Functional Limits for tasks assessed Arousal/Alertness: Awake/alert Orientation Level: Oriented X4 Attention: Focused;Sustained Focused Attention: Appears intact Sustained Attention: Appears intact Memory: Appears intact Awareness: Appears intact Problem Solving: Appears  intact Safety/Judgment: Appears intact Sensation Sensation Light Touch: Impaired Detail Light Touch Impaired Details: Impaired LLE Proprioception: Impaired Detail Proprioception Impaired Details: Impaired RLE;Impaired LLE Coordination Gross Motor Movements are Fluid and Coordinated: No Fine Motor Movements are Fluid and Coordinated: Yes Finger Nose Finger Test: WNL Heel Shin Test: impaired due to strength deficits Motor  Motor Motor: Paraplegia Motor - Discharge Observations: active movement in BLE approximately 3/5 to 4-/5 proximally at hip/knee, 0/5 LLE distally, 2/5 RLE distally Mobility  Bed Mobility Bed Mobility: Rolling Right;Rolling Left;Sit to Supine;Supine to Sit Rolling Right: With rail;6: Modified independent (Device/Increase time) Rolling Left: 6: Modified independent (Device/Increase time);With rail Supine to Sit: 5: Supervision Sit to Supine: 4: Min assist Sit to Supine - Details: Tactile cues for posture;Tactile cues for placement;Manual facilitation for placement  Trunk/Postural Assessment  Cervical Assessment Cervical Assessment: Within Functional Limits Thoracic Assessment Thoracic Assessment: Within Functional Limits Lumbar Assessment Lumbar Assessment: Within Functional Limits Postural Control Postural Control: Deficits on evaluation (impaired righting reactions, stepping strategies for balance in standing due to LE strength deficits)  Balance Balance Balance Assessed: Yes Static Sitting Balance Static Sitting - Balance Support: Feet supported Static Sitting - Level of Assistance: 6: Modified independent (Device/Increase time) Dynamic Sitting Balance Dynamic Sitting - Balance Support: No upper extremity supported;Feet supported;During functional activity Dynamic Sitting - Level of Assistance: 6: Modified independent (Device/Increase time) Static Standing Balance Static Standing - Balance Support: Bilateral upper extremity supported Static Standing -  Level of Assistance: 5: Stand by assistance Dynamic Standing Balance Dynamic Standing - Balance Support: Left upper extremity supported;During functional activity Dynamic Standing - Level of Assistance: 4: Min assist Dynamic Standing - Balance Activities: Forward lean/weight shifting;Reaching across midline;Reaching for objects Extremity/Trunk Assessment RUE Assessment RUE Assessment: Within Functional Limits LUE Assessment LUE Assessment: Within Functional Limits   See Function Navigator for Current Functional Status.  Simonne Come 12/02/2015, 3:17 PM
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
## 2015-12-02 NOTE — Plan of Care (Signed)
Problem: RH SKIN INTEGRITY Goal: RH STG MAINTAIN SKIN INTEGRITY WITH ASSISTANCE STG Maintain Skin Integrity With min Assistance.  Outcome: Not Progressing Total assistance
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
## 2015-12-02 NOTE — Progress Notes (Signed)
Physical Therapy Discharge Summary  Patient Details  Name: Marie [name] MRN: [id number] Date of Birth: [birth year]  Today's Date: 12/02/2015 PT Individual Time: 6659-9357 and 1015-1100 PT Individual Time Calculation (min): 45 min and 45 min (total 90 min)    Patient has met 10 of 10 long term goals due to improved activity tolerance, improved balance, improved postural control, increased strength, decreased pain, ability to compensate for deficits and functional use of  right lower extremity and left lower extremity.  Patient to discharge at a wheelchair level Supervision.   Patient's care partner is independent to provide the necessary physical assistance at discharge.  Reasons goals not met: All goals met  Recommendation:  Patient will benefit from ongoing skilled PT services in home health setting to continue to advance safe functional mobility, address ongoing impairments in strength, coordination, endurance, balance, activity tolerance, and minimize fall risk.  Equipment: W/c, transfer board  Reasons for discharge: treatment goals met and discharge from hospital  Patient/family agrees with progress made and goals achieved: Yes  PT Discharge Precautions/Restrictions Precautions Precautions: Back;Fall Precaution Comments: instructed in back precautions Required Braces or Orthoses: Spinal Brace Spinal Brace: Thoracolumbosacral orthotic;Applied in sitting position Restrictions Weight Bearing Restrictions: No Pain Pain Assessment Pain Assessment: No/denies pain Pain Score: 0-No pain Vision/Perception  Perception Comments: WFL  Cognition Overall Cognitive Status: Within Functional Limits for tasks assessed Arousal/Alertness: Awake/alert Orientation Level: Oriented X4 Attention: Focused;Sustained Focused Attention: Appears intact Sustained Attention: Appears intact Memory: Appears intact Awareness: Appears intact Problem Solving: Appears intact Safety/Judgment:  Appears intact Sensation Sensation Light Touch: Impaired Detail Light Touch Impaired Details: Impaired LLE Proprioception: Impaired Detail Proprioception Impaired Details: Impaired RLE;Impaired LLE Coordination Gross Motor Movements are Fluid and Coordinated: No Heel Shin Test: impaired due to strength deficits Motor  Motor Motor: Paraplegia Motor - Discharge Observations: active movement in BLE approximately 3/5 to 4-/5 proximally at hip/knee, 0/5 LLE distally, 2/5 RLE distally  Mobility Bed Mobility Bed Mobility: Rolling Right;Rolling Left;Sit to Supine;Supine to Sit Rolling Right: With rail;6: Modified independent (Device/Increase time) Rolling Left: 6: Modified independent (Device/Increase time);With rail Supine to Sit: 5: Supervision Sit to Supine: 4: Min assist Sit to Supine - Details: Tactile cues for posture;Tactile cues for placement;Manual facilitation for placement Transfers Transfers: Yes Lateral/Scoot Transfers: 5: Supervision Lateral/Scoot Transfer Details: Verbal cues for precautions/safety Lateral/Scoot Transfer Details (indicate cue type and reason): S with and without transfer board to level surface height, use of board for unlevel transfers Locomotion  Ambulation Ambulation: Yes Ambulation/Gait Assistance: 4: Min assist;1: +2 Total assist (minA for ambulation, +2 for w/c follow for safety) Ambulation Distance (Feet): 30 Feet Assistive device: Rolling walker;Other (Comment) (LLE air cast for ankle stability) Ambulation/Gait Assistance Details: Verbal cues for technique;Verbal cues for gait pattern;Verbal cues for safe use of DME/AE;Tactile cues for posture;Tactile cues for weight beaing Gait Gait: Yes Gait Pattern: Left genu recurvatum;Lateral hip instability;Trunk flexed;Poor foot clearance - left Gait velocity: decreased for age/gender norms Stairs / Additional Locomotion Stairs: No Wheelchair Mobility Wheelchair Mobility: Yes Wheelchair Assistance: 6:  Modified independent (Device/Increase time) Environmental health practitioner: Both upper extremities Wheelchair Parts Management: Independent Distance: 300  Trunk/Postural Assessment  Cervical Assessment Cervical Assessment: Within Functional Limits Thoracic Assessment Thoracic Assessment: Within Functional Limits Lumbar Assessment Lumbar Assessment: Within Functional Limits Postural Control Postural Control: Deficits on evaluation (impaired righting reactions, stepping strategies for balance in standing due to LE strength deficits)  Balance Balance Balance Assessed: Yes Static Sitting Balance Static Sitting - Balance Support: Feet supported Static Sitting -  Level of Assistance: 6: Modified independent (Device/Increase time) Dynamic Sitting Balance Dynamic Sitting - Balance Support: No upper extremity supported;Feet supported;During functional activity Dynamic Sitting - Level of Assistance: 6: Modified independent (Device/Increase time) Static Standing Balance Static Standing - Balance Support: Bilateral upper extremity supported Static Standing - Level of Assistance: 5: Stand by assistance Dynamic Standing Balance Dynamic Standing - Balance Support: Left upper extremity supported;During functional activity Dynamic Standing - Level of Assistance: 4: Min assist Dynamic Standing - Balance Activities: Forward lean/weight shifting;Reaching across midline;Reaching for objects Extremity Assessment  RUE Assessment RUE Assessment: Within Functional Limits LUE Assessment LUE Assessment: Within Functional Limits RLE Assessment RLE Assessment: Exceptions to [facility] (hip flexion 3/5, hip extension 3/5, knee extension 4-/5, knee flexion 4/5, ankle dorsiflexion 4-/5, ankle plantarflexion 4-/5) LLE Assessment LLE Assessment: Exceptions to [facility] (Hip flexion 3/5, hip extension 2/5, knee extension 4-/5, knee flexion 4-/5, ankle dorsiflexion 0/5, ankle plantarflexion 0/5)   Skilled Therapeutic Intervention:   Tx 1: Pt received seated in w/c, no c/o pain and agreeable to treatment. W/c propulsion to/from gym [phone]' with mod I. Gait in parallel bars 3x10' with minA and cueing at LLE for stance control to prevent buckling and hyperextension. Gait with RW in hallway minA for ambulation, +2A for w/c follow for safety; performed 3 x 30' each with seated rest breaks between due to fatigue. Education on [employer] for safety and reduce UE reliance. Returned to room as above, remained seated in w/c at completion of session with NA alerted to pt request to return to bed.    Tx2: Pt received seated in w/c with family Butch Penny present, no c/o pain and agreeable to treatment. Sit <>stand x2 trials to change w/c cushion for better fit; min guard sit >stand with RW. W/c propulsion 2x300' including uneven surfaces, indoors/outdoors, incline/declines, on/off elevator with mod I. Car transfer performed with Butch Penny providing set-up assist, handing pt transfer board as needed, however pt able to perform without any hands-on assist. Educated pt and family in setting reasonable short and long term goals for the future, recommendation that pt perform gait only with home health therapist initially until performing more consistently; pt and family agreeable. Returned to room with pt performing w/c propulsion mod I. Transfer w/c >bed without transfer board and S. Sit >supine minA for LE management. Pt remained seated in bed with all needs in reach at completion of session.   See Function Navigator for Current Functional Status.  [name] 12/02/2015, 2:56 PM

## 2015-12-02 NOTE — Progress Notes (Signed)
Occupational Therapy Session Note  Patient Details  Name: Marie [name] MRN: [id number] Date of Birth: 03/12/1991  Today's Date: 12/02/2015 OT Individual Time: 4098-1191 and 1300-1400 OT Individual Time Calculation (min): 75 min and 60 min   Short Term Goals: Week 3:  OT Short Term Goal 1 (Week 3): STG = LTGs due to remaining LOS  Skilled Therapeutic Interventions/Progress Updates:    1) Treatment session with focus on functional transfers and increased independence with self-care tasks and IADLs.  Pt completed bathing seated at EOB with lateral leans and use of long handled sponge to reach feet.  Dressing completed with lateral leans and use of reacher.  Pt able to pull pants up mostly over hips but unable to complete at seated level.  Engaged in sit > stand with pt pulling up on RW with therapist stabilizing RW and providing steady assist. Pt able to pull pants over hips in standing, returning to sit to fully adjust over Lt hip. Performed lateral scoot bed > BSC without slide board, pt doffing pants to toilet with use of lateral leans.  Lateral leans to pull pants over hips, again requiring sit > stand to fully pull pants over hips.  Therapist providing steady assist at [facility] for stability and at hips while pulling pants up.  Lateral scoot BSC > w/c with setup and close supervision.  Pt completed grooming tasks seated at sink without assist.  Engaged in simple meal prep and cleaning afterwards with supervision/setup.  Pt able to reach most items, requiring assist when unable to reach items from higher shelves.  Pt demonstrating good carryover of w/c positioning and safety.  2) Treatment session with focus on hands on family education with future mother-in-law, [name], who is to be primary caregiver.  [name] not present at beginning of session.  Engaged in bed mobility and donning TLSO seated at EOB in preparation for OOB activity.  Engaged in sit > stand with RW with pt requiring min-mod assist to  stabilize secondary to BLE weakness.  Pt reports ambulating with PT today and more fatigued this afternoon.  Discussed energy conservation strategies and spacing out activities at home to increase success with self-care and mobility tasks.  [name] arrived, educated on routine with bathing and dressing from EOB, w/c, or BSC with setup assist and use of AE.  Recommend purchasing AE to increase independence with LB self-care tasks.  [name] completed sit > stand with pt while demonstrating appropriate body positioning and stabilizing of RW.  Pt demonstrated and talked [name] through proper placement of slide board and transfers bed <> BSC and bed <> w/c.  Reiterated safety and recommended supervision to min assist with all self-care tasks as well as defer shower transfer training to [facility] to problem solve home setup.  Therapy Documentation Precautions:  Precautions Precautions: Back, Fall Precaution Booklet Issued: No Precaution Comments: instructed in back precautions Required Braces or Orthoses: Spinal Brace Spinal Brace: Thoracolumbosacral orthotic, Applied in sitting position Restrictions Weight Bearing Restrictions: No Pain: Pain Assessment Pain Assessment: 0-10 Pain Score: 4  Pain Type: Surgical pain Pain Location: Back Patients Stated Pain Goal: 2 Pain Intervention(s): Medication (See eMAR) (oxycodone 10 mg) ADL: ADL ADL Comments: refer to functional navigator  See Function Navigator for Current Functional Status.   Therapy/Group: Individual Therapy  Rosalio Loud 12/02/2015, 9:21 AM

## 2015-12-03 MED ORDER — SENNA 8.6 MG PO TABS: 1.0000 | ORAL_TABLET | Freq: Two times a day (BID) | ORAL | Status: DC

## 2015-12-03 MED ORDER — OXYBUTYNIN CHLORIDE 5 MG PO TABS: 2.5000 mg | ORAL_TABLET | Freq: Every day | ORAL | Status: DC

## 2015-12-03 MED ORDER — OXYCODONE HCL 5 MG PO TABS: 5.0000 mg | ORAL_TABLET | ORAL | Status: DC | PRN

## 2015-12-03 MED ORDER — BISACODYL 10 MG RE SUPP: 10.0000 mg | Freq: Every day | RECTAL | Status: DC

## 2015-12-03 MED ORDER — POLYETHYLENE GLYCOL 3350 17 G PO PACK: 17.0000 g | PACK | Freq: Every day | ORAL | Status: DC

## 2015-12-03 MED ORDER — DOCUSATE SODIUM 100 MG PO CAPS: 100.0000 mg | ORAL_CAPSULE | Freq: Three times a day (TID) | ORAL | Status: DC

## 2015-12-03 MED ORDER — NICOTINE 7 MG/24HR TD PT24: MEDICATED_PATCH | TRANSDERMAL | Status: DC

## 2015-12-03 NOTE — Plan of Care (Signed)
Problem: SCI BOWEL ELIMINATION Goal: RH STG MANAGE BOWEL WITH ASSISTANCE STG Manage Bowel with Assistance. Total A  Outcome: Completed/Met Date Met:  12/03/15 patient's mother in law verbalized how to perform insertion of dulcolax suppository

## 2015-12-03 NOTE — Progress Notes (Signed)
Social Work  Discharge Note  The overall goal for the admission was met for:   Discharge location: Yes-HOME WITH M-I-L, FINANCE AND M-I-L HUSBAND  Length of Stay: Yes-21 DAYS  Discharge activity level: Yes-SUPERVISION/MIN WHEELCHAIR LEVEL  Home/community participation: Yes  Services provided included: MD, RD, PT, OT, RN, CM, TR, Pharmacy, Neuropsych and SW  Financial Services: Private Insurance: [employer]  Follow-up services arranged: Home Health: [facility]-PT,OT,RN, DME: [facility], 24 TRANSFER BOARD, WIDE DROP-ARM BEDSIDE COMMODE NU-MOTION-WHEELCHAIR and Patient/Family request agency HH: WORKS FOR THIS HH AGENCY, DME: NO PREF  Comments (or additional information):DONNA WAS HERE TO LEARN PT'S CARE AND BOTH COMFORTABLE WITH IT, 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 PT HAS SSD AND MEDICAID APPLICATION PENDING.  DECLINED OUTPATIENT COUNSELORS WILL CONTACT IF CHANGES HER MIND.   Patient/Family verbalized understanding of follow-up arrangements: Yes  Individual responsible for coordination of the follow-up plan: PATIENT & DONNA-M-I-L  Confirmed correct DME delivered: [name], Marie G 12/03/2015    [name], Marie G 

## 2015-12-03 NOTE — Progress Notes (Signed)
Patient discharged to home with mother in law at approximately 1200 via wheelchair with all belongings. Patient's mother in law demonstrated how to clean and dry back incision. Mother in law changed dressing successfully. Reviewed bowel program and mother in law reported she understood responsibility. Mother in law did not perform bowel program. Patient and mother in law verbalized understanding of discharge instructions given and reviewed by D. Angiuilli, PA. Cleotilde Neer

## 2015-12-03 NOTE — Discharge Instructions (Signed)
Inpatient Rehab Discharge Instructions  MIKENNA BUNKLEY Discharge date and time: No discharge date for patient encounter.   Activities/Precautions/ Functional Status: Activity: Back brace when out of bed applied in sitting position Diet: regular diet Wound Care: keep wound clean and dry Functional status:  ___ No restrictions     ___ Walk up steps independently ___ 24/7 supervision/assistance   ___ Walk up steps with assistance ___ Intermittent supervision/assistance  ___ Bathe/dress independently ___ Walk with walker     _x__ Bathe/dress with assistance ___ Walk Independently    ___ Shower independently ___ Walk with assistance    ___ Shower with assistance ___ No alcohol     ___ Return to work/school ________  Special Instructions: Back brace when out of bed applied the sitting position   COMMUNITY REFERRALS UPON DISCHARGE:    Home Health:   PT, OT, RN  [facility]  Phone:[phone] Date of last service:12/03/2015   Medical Equipment/Items Ordered:WHEELCHAIR-NU-MOTION [id number] HOSPITAL BED, 24 TRANSFER [employer], [facility]   [phone]   Other:SSD AND MEDICAID APPLICATION PENDING  GENERAL COMMUNITY RESOURCES FOR PATIENT/FAMILY: EMPLOYMENT ASSISTANCE: VOC REHAB [phone] INDEPENDENT LIVING:    My questions have been answered and I understand these instructions. I will adhere to these goals and the provided educational materials after my discharge from the hospital.  Patient/Caregiver Signature _______________________________ Date __________  Clinician Signature _______________________________________ Date __________  Please bring this form and your medication list with you to all your follow-up doctor's appointments.

## 2015-12-03 NOTE — Progress Notes (Signed)
Social Work Patient ID: Marie [name], female   DOB: 19-Oct-1991, 25 y.o.   MRN: [id number] [name] ants pt to have [facility] follow up and provide home health, pt is fine with this. Have switched referral to them. Have contacted Genevieve Norlander to pull referral.  To apply for SSD phone interview this am prior to discharge.
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
## 2015-12-23 ENCOUNTER — Inpatient Hospital Stay: Payer: PRIVATE HEALTH INSURANCE | Admitting: Physical Medicine & Rehabilitation

## 2015-12-23 ENCOUNTER — Encounter: Payer: Worker's Compensation | Attending: Physical Medicine & Rehabilitation

## 2016-01-07 ENCOUNTER — Encounter (HOSPITAL_COMMUNITY): Payer: Self-pay | Admitting: *Deleted

## 2016-01-07 ENCOUNTER — Other Ambulatory Visit: Payer: Self-pay | Admitting: Neurosurgery

## 2016-01-07 NOTE — Progress Notes (Signed)
Patient was instructed by Dr Sueanne Margaritaabbell's office - nothing to eat or drink after 8:00 AM.

## 2016-01-08 ENCOUNTER — Encounter (HOSPITAL_COMMUNITY)
Admission: RE | Disposition: A | Discharge status: Home / Self Care | Payer: Self-pay | Source: Ambulatory Visit | Attending: Neurosurgery

## 2016-01-08 ENCOUNTER — Ambulatory Visit: Admit: 2016-01-08 | Payer: Self-pay | Admitting: Neurosurgery

## 2016-01-08 ENCOUNTER — Ambulatory Visit (HOSPITAL_COMMUNITY)
Admission: RE | Admit: 2016-01-08 | Discharge: 2016-01-08 | Disposition: A | Discharge status: Home / Self Care | Payer: PRIVATE HEALTH INSURANCE | Source: Ambulatory Visit | Attending: Neurosurgery | Admitting: Neurosurgery

## 2016-01-08 ENCOUNTER — Ambulatory Visit (HOSPITAL_COMMUNITY): Payer: PRIVATE HEALTH INSURANCE | Admitting: Anesthesiology

## 2016-01-08 ENCOUNTER — Encounter (HOSPITAL_COMMUNITY): Payer: Self-pay | Admitting: *Deleted

## 2016-01-08 DIAGNOSIS — G8220 Paraplegia, unspecified: Secondary | ICD-10-CM | POA: Insufficient documentation

## 2016-01-08 DIAGNOSIS — N319 Neuromuscular dysfunction of bladder, unspecified: Secondary | ICD-10-CM | POA: Diagnosis not present

## 2016-01-08 DIAGNOSIS — Z981 Arthrodesis status: Secondary | ICD-10-CM | POA: Insufficient documentation

## 2016-01-08 DIAGNOSIS — F17210 Nicotine dependence, cigarettes, uncomplicated: Secondary | ICD-10-CM | POA: Insufficient documentation

## 2016-01-08 DIAGNOSIS — Z6834 Body mass index (BMI) 34.0-34.9, adult: Secondary | ICD-10-CM | POA: Insufficient documentation

## 2016-01-08 DIAGNOSIS — T8131XA Disruption of external operation (surgical) wound, not elsewhere classified, initial encounter: Secondary | ICD-10-CM | POA: Diagnosis not present

## 2016-01-08 DIAGNOSIS — T8130XA Disruption of wound, unspecified, initial encounter: Secondary | ICD-10-CM | POA: Diagnosis present

## 2016-01-08 DIAGNOSIS — K592 Neurogenic bowel, not elsewhere classified: Secondary | ICD-10-CM | POA: Insufficient documentation

## 2016-01-08 HISTORY — PX: LUMBAR WOUND DEBRIDEMENT: SHX1988

## 2016-01-08 HISTORY — DX: Neurogenic bowel, not elsewhere classified: K59.2

## 2016-01-08 HISTORY — DX: Other neuromuscular dysfunction of bladder: N31.8

## 2016-01-08 HISTORY — DX: Disease of spinal cord, unspecified: G95.9

## 2016-01-08 HISTORY — DX: Neuromuscular dysfunction of bladder, unspecified: N31.9

## 2016-01-08 HISTORY — DX: Paraplegia, unspecified: G82.20

## 2016-01-08 HISTORY — DX: Obesity, unspecified: E66.9

## 2016-01-08 LAB — SURGICAL PCR SCREEN
MRSA, PCR: NEGATIVE
STAPHYLOCOCCUS AUREUS: POSITIVE — AB

## 2016-01-08 LAB — HCG, SERUM, QUALITATIVE: PREG SERUM: NEGATIVE

## 2016-01-08 LAB — HEMOGLOBIN: Hemoglobin: 13.9 g/dL (ref 12.0–15.0)

## 2016-01-08 SURGERY — LUMBAR WOUND DEBRIDEMENT
Anesthesia: General

## 2016-01-08 SURGERY — LUMBAR WOUND DEBRIDEMENT
Anesthesia: General | Site: Spine Lumbar

## 2016-01-08 MED ORDER — LACTATED RINGERS IV SOLN
INTRAVENOUS | Status: DC | PRN
  Administered 2016-01-08: 16:00:00 via INTRAVENOUS

## 2016-01-08 MED ORDER — MIDAZOLAM HCL 2 MG/2ML IJ SOLN
INTRAMUSCULAR | Status: AC
  Filled 2016-01-08: qty 2

## 2016-01-08 MED ORDER — LIDOCAINE-EPINEPHRINE (PF) 1 %-1:200000 IJ SOLN
INTRAMUSCULAR | Status: DC | PRN
  Administered 2016-01-08: 20 mL

## 2016-01-08 MED ORDER — OXYCODONE HCL 5 MG PO TABS: 5.0000 mg | ORAL_TABLET | Freq: Four times a day (QID) | ORAL | Status: DC | PRN

## 2016-01-08 MED ORDER — MIDAZOLAM HCL 5 MG/5ML IJ SOLN
INTRAMUSCULAR | Status: DC | PRN
  Administered 2016-01-08 (×2): 1 mg via INTRAVENOUS

## 2016-01-08 MED ORDER — SUCCINYLCHOLINE CHLORIDE 20 MG/ML IJ SOLN
INTRAMUSCULAR | Status: DC | PRN
  Administered 2016-01-08: 120 mg via INTRAVENOUS

## 2016-01-08 MED ORDER — FENTANYL CITRATE (PF) 100 MCG/2ML IJ SOLN
INTRAMUSCULAR | Status: DC | PRN
  Administered 2016-01-08 (×2): 50 ug via INTRAVENOUS
  Administered 2016-01-08: 150 ug via INTRAVENOUS

## 2016-01-08 MED ORDER — SODIUM CHLORIDE 0.9 % IJ SOLN
INTRAMUSCULAR | Status: AC
  Filled 2016-01-08: qty 10

## 2016-01-08 MED ORDER — ONDANSETRON HCL 4 MG/2ML IJ SOLN
INTRAMUSCULAR | Status: AC
  Filled 2016-01-08: qty 2

## 2016-01-08 MED ORDER — CEFAZOLIN SODIUM-DEXTROSE 2-3 GM-% IV SOLR
INTRAVENOUS | Status: AC
  Administered 2016-01-08: 2 g via INTRAVENOUS
  Filled 2016-01-08: qty 50

## 2016-01-08 MED ORDER — MUPIROCIN 2 % EX OINT
TOPICAL_OINTMENT | CUTANEOUS | Status: AC
  Administered 2016-01-08: 1
  Filled 2016-01-08: qty 22

## 2016-01-08 MED ORDER — PHENYLEPHRINE 40 MCG/ML (10ML) SYRINGE FOR IV PUSH (FOR BLOOD PRESSURE SUPPORT)
PREFILLED_SYRINGE | INTRAVENOUS | Status: AC
  Filled 2016-01-08: qty 10

## 2016-01-08 MED ORDER — LACTATED RINGERS IV SOLN: INTRAVENOUS | Status: DC

## 2016-01-08 MED ORDER — ONDANSETRON HCL 4 MG/2ML IJ SOLN
INTRAMUSCULAR | Status: DC | PRN
  Administered 2016-01-08: 4 mg via INTRAVENOUS

## 2016-01-08 MED ORDER — 0.9 % SODIUM CHLORIDE (POUR BTL) OPTIME
TOPICAL | Status: DC | PRN
Start: 2016-01-08 — End: 2016-01-08
  Administered 2016-01-08: 1000 mL

## 2016-01-08 MED ORDER — FENTANYL CITRATE (PF) 250 MCG/5ML IJ SOLN
INTRAMUSCULAR | Status: AC
  Filled 2016-01-08: qty 5

## 2016-01-08 MED ORDER — HYDROMORPHONE HCL 1 MG/ML IJ SOLN: 0.2500 mg | INTRAMUSCULAR | Status: DC | PRN

## 2016-01-08 MED ORDER — EPHEDRINE SULFATE 50 MG/ML IJ SOLN
INTRAMUSCULAR | Status: AC
Start: 2016-01-08 — End: 2016-01-08
  Filled 2016-01-08: qty 1

## 2016-01-08 MED ORDER — ROCURONIUM BROMIDE 50 MG/5ML IV SOLN
INTRAVENOUS | Status: AC
  Filled 2016-01-08: qty 1

## 2016-01-08 MED ORDER — MEPERIDINE HCL 25 MG/ML IJ SOLN: 6.2500 mg | INTRAMUSCULAR | Status: DC | PRN

## 2016-01-08 MED ORDER — PROPOFOL 10 MG/ML IV BOLUS
INTRAVENOUS | Status: DC | PRN
  Administered 2016-01-08: 100 mg via INTRAVENOUS
  Administered 2016-01-08: 200 mg via INTRAVENOUS
  Administered 2016-01-08: 30 mg via INTRAVENOUS

## 2016-01-08 MED ORDER — SUCCINYLCHOLINE CHLORIDE 20 MG/ML IJ SOLN
INTRAMUSCULAR | Status: AC
  Filled 2016-01-08: qty 1

## 2016-01-08 MED ORDER — PROMETHAZINE HCL 25 MG/ML IJ SOLN: 6.2500 mg | INTRAMUSCULAR | Status: DC | PRN

## 2016-01-08 MED ORDER — CEPHALEXIN 500 MG PO CAPS: 500.0000 mg | ORAL_CAPSULE | Freq: Four times a day (QID) | ORAL | Status: DC

## 2016-01-08 MED ORDER — LIDOCAINE HCL (CARDIAC) 20 MG/ML IV SOLN
INTRAVENOUS | Status: AC
  Filled 2016-01-08: qty 10

## 2016-01-08 MED ORDER — PROPOFOL 10 MG/ML IV BOLUS
INTRAVENOUS | Status: AC
  Filled 2016-01-08: qty 20

## 2016-01-08 MED ORDER — LIDOCAINE HCL (CARDIAC) 20 MG/ML IV SOLN
INTRAVENOUS | Status: AC
  Filled 2016-01-08: qty 5

## 2016-01-08 SURGICAL SUPPLY — 41 items
BAG DECANTER FOR FLEXI CONT (MISCELLANEOUS) IMPLANT
BENZOIN TINCTURE PRP APPL 2/3 (GAUZE/BANDAGES/DRESSINGS) IMPLANT
BLADE CLIPPER SURG (BLADE) IMPLANT
CANISTER SUCT 3000ML PPV (MISCELLANEOUS) ×2 IMPLANT
DECANTER SPIKE VIAL GLASS SM (MISCELLANEOUS) IMPLANT
DRAPE LAPAROTOMY 100X72X124 (DRAPES) ×2 IMPLANT
DRAPE POUCH INSTRU U-SHP 10X18 (DRAPES) ×2 IMPLANT
DRAPE SURG 17X23 STRL (DRAPES) ×2 IMPLANT
DRSG OPSITE POSTOP 4X6 (GAUZE/BANDAGES/DRESSINGS) ×2 IMPLANT
DURAPREP 26ML APPLICATOR (WOUND CARE) ×2 IMPLANT
ELECT REM PT RETURN 9FT ADLT (ELECTROSURGICAL) ×2
ELECTRODE REM PT RTRN 9FT ADLT (ELECTROSURGICAL) ×1 IMPLANT
GAUZE SPONGE 4X4 12PLY STRL (GAUZE/BANDAGES/DRESSINGS) IMPLANT
GAUZE SPONGE 4X4 16PLY XRAY LF (GAUZE/BANDAGES/DRESSINGS) IMPLANT
GLOVE BIO SURGEON STRL SZ7 (GLOVE) ×4 IMPLANT
GLOVE BIOGEL PI IND STRL 7.5 (GLOVE) ×1 IMPLANT
GLOVE BIOGEL PI INDICATOR 7.5 (GLOVE) ×1
GLOVE ECLIPSE 6.5 STRL STRAW (GLOVE) ×2 IMPLANT
GOWN STRL REUS W/ TWL LRG LVL3 (GOWN DISPOSABLE) ×1 IMPLANT
GOWN STRL REUS W/ TWL XL LVL3 (GOWN DISPOSABLE) ×1 IMPLANT
GOWN STRL REUS W/TWL 2XL LVL3 (GOWN DISPOSABLE) IMPLANT
GOWN STRL REUS W/TWL LRG LVL3 (GOWN DISPOSABLE) ×1
GOWN STRL REUS W/TWL XL LVL3 (GOWN DISPOSABLE) ×1
KIT BASIN OR (CUSTOM PROCEDURE TRAY) ×2 IMPLANT
KIT ROOM TURNOVER OR (KITS) ×2 IMPLANT
NS IRRIG 1000ML POUR BTL (IV SOLUTION) ×2 IMPLANT
PACK LAMINECTOMY NEURO (CUSTOM PROCEDURE TRAY) ×2 IMPLANT
PAD ARMBOARD 7.5X6 YLW CONV (MISCELLANEOUS) ×6 IMPLANT
SPONGE LAP 4X18 X RAY DECT (DISPOSABLE) IMPLANT
SPONGE SURGIFOAM ABS GEL SZ50 (HEMOSTASIS) IMPLANT
STRIP CLOSURE SKIN 1/2X4 (GAUZE/BANDAGES/DRESSINGS) IMPLANT
SUT ETHILON 3 0 FSL (SUTURE) ×2 IMPLANT
SUT VIC AB 0 CT1 18XCR BRD8 (SUTURE) ×1 IMPLANT
SUT VIC AB 0 CT1 8-18 (SUTURE) ×1
SUT VIC AB 2-0 CT1 18 (SUTURE) IMPLANT
SUT VIC AB 3-0 SH 8-18 (SUTURE) ×2 IMPLANT
SWAB COLLECTION DEVICE MRSA (MISCELLANEOUS) ×2 IMPLANT
TOWEL OR 17X24 6PK STRL BLUE (TOWEL DISPOSABLE) ×2 IMPLANT
TOWEL OR 17X26 10 PK STRL BLUE (TOWEL DISPOSABLE) ×2 IMPLANT
TUBE ANAEROBIC SPECIMEN COL (MISCELLANEOUS) ×2 IMPLANT
WATER STERILE IRR 1000ML POUR (IV SOLUTION) ×2 IMPLANT

## 2016-01-08 NOTE — Discharge Instructions (Signed)
Call the office if the wound continues to drain. You may remove the dressing on Saturday. You may shower tomorrow.  You were given antibiotics, please finish the prescription over the next 10 days Call the office if your temperature is greater than 101.5 Maintain your normal activities

## 2016-01-08 NOTE — Anesthesia Preprocedure Evaluation (Addendum)
Anesthesia Evaluation  Patient identified by MRN, date of birth, ID band Patient awake    Reviewed: Allergy & Precautions, NPO status , Patient's Chart, lab work & pertinent test results  Airway Mallampati: I  TM Distance: >3 FB Neck ROM: Full    Dental  (+) Teeth Intact, Dental Advisory Given   Pulmonary Current Smoker,    breath sounds clear to auscultation       Cardiovascular negative cardio ROS   Rhythm:Regular Rate:Normal     Neuro/Psych PSYCHIATRIC DISORDERS  Neuromuscular disease    GI/Hepatic negative GI ROS, Neg liver ROS,   Endo/Other  negative endocrine ROSMorbid obesity  Renal/GU negative Renal ROS  negative genitourinary   Musculoskeletal negative musculoskeletal ROS (+)   Abdominal   Peds negative pediatric ROS (+)  Hematology negative hematology ROS (+)   Anesthesia Other Findings   Reproductive/Obstetrics negative OB ROS                            11/2015 EKG: normal sinus rhythm.   Anesthesia Physical Anesthesia Plan  ASA: III  Anesthesia Plan: General   Post-op Pain Management:    Induction: Intravenous  Airway Management Planned: Oral ETT  Additional Equipment:   Intra-op Plan:   Post-operative Plan: Extubation in OR  Informed Consent: I have reviewed the patients History and Physical, chart, labs and discussed the procedure including the risks, benefits and alternatives for the proposed anesthesia with the patient or authorized representative who has indicated his/her understanding and acceptance.   Dental advisory given  Plan Discussed with: CRNA, Anesthesiologist and Surgeon  Anesthesia Plan Comments:        Anesthesia Quick Evaluation
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
## 2016-01-08 NOTE — Anesthesia Procedure Notes (Signed)
Procedure Name: Intubation Date/Time: 01/08/2016 4:20 PM Performed by: Izora Gala Pre-anesthesia Checklist: Patient identified, Emergency Drugs available and Suction available Patient Re-evaluated:Patient Re-evaluated prior to inductionOxygen Delivery Method: Circle system utilized Preoxygenation: Pre-oxygenation with 100% oxygen Intubation Type: IV induction Ventilation: Mask ventilation without difficulty Laryngoscope Size: Miller and 3 Grade View: Grade I Tube type: Oral Tube size: 7.5 mm Number of attempts: 1 Airway Equipment and Method: Stylet and LTA kit utilized Placement Confirmation: ETT inserted through vocal cords under direct vision,  positive ETCO2 and breath sounds checked- equal and bilateral Secured at: 22 cm Tube secured with: Tape Dental Injury: Teeth and Oropharynx as per pre-operative assessment

## 2016-01-08 NOTE — Transfer of Care (Signed)
Immediate Anesthesia Transfer of Care Note  Patient: Marie [name]  Procedure(s) Performed: Procedure(s) with comments: WOUND REVISION (N/A) - over sewing incision  Patient Location: PACU  Anesthesia Type:General  Level of Consciousness: awake, alert  and patient cooperative  Airway & Oxygen Therapy: Patient Spontanous Breathing  Post-op Assessment: Report given to RN, Patient moving all extremities and Patient able to stick tongue midline  Post vital signs: Reviewed and stable  Last Vitals:  Filed Vitals:   01/08/16 1535  BP: 112/69  Pulse: 82  Temp: 36.7 C  Resp: 20    Complications: No apparent anesthesia complications

## 2016-01-08 NOTE — Anesthesia Postprocedure Evaluation (Signed)
Anesthesia Post Note  Patient: Marie [name]  Procedure(s) Performed: Procedure(s) (LRB): WOUND REVISION (N/A)  Patient location during evaluation: PACU Anesthesia Type: General Level of consciousness: awake and alert Pain management: pain level controlled Vital Signs Assessment: post-procedure vital signs reviewed and stable Respiratory status: spontaneous breathing, nonlabored ventilation and respiratory function stable Cardiovascular status: blood pressure returned to baseline and stable Postop Assessment: no signs of nausea or vomiting Anesthetic complications: no    Last Vitals:  Filed Vitals:   01/08/16 1535 01/08/16 1705  BP: 112/69 113/60  Pulse: 82 91  Temp: 36.7 C 36.2 C  Resp: 20 16    Last Pain:  Filed Vitals:   01/08/16 1714  PainSc: 2                  Kynsley Whitehouse A

## 2016-01-08 NOTE — OR Nursing (Signed)
Discharge paper work was given to patients "future mother and father in law". Discharge instructions were reviewed with patient and family. Patient and family denied any questions regarding discharge instructions. Pt family and I assisted patient in getting dressed and into patients own wheelchair.

## 2016-01-08 NOTE — Op Note (Signed)
BP 112/69 mmHg  Pulse 82  Temp(Src) 98.1 F (36.7 C) (Oral)  Resp 20  Ht 5\' 6"  (1.676 m)  Wt 97.977 kg (216 lb)  BMI 34.88 kg/m2  SpO2 99%  LMP 12/18/2015 (Exact Date) 01/08/2016  5:04 PM  PATIENT:  Marie [name]  25 y.o. female with persistent drainage from approximately the lower half of the wound. The drainage is not purulent. I recommended the wound be revised and she is admitted today for that.  PRE-OPERATIVE DIAGNOSIS:  wound dehiscence  POST-OPERATIVE DIAGNOSIS:  wound dehiscence  PROCEDURE:  Procedure(s):Lumbar WOUND REVISION  SURGEON: Surgeon(s): Coletta MemosKyle Margeret Stachnik, MD  ASSISTANTS:none  ANESTHESIA:   local and general  EBL:     BLOOD ADMINISTERED:none  CELL SAVER GIVEN:none  COUNT:per nursing  DRAINS: none   SPECIMEN:  Source of Specimen:  wound   DICTATION: Marie [name] was taken to the operating room, intubated, and placed under a general anesthetic without difficulty. female She was positioned prone on a wilson frame with all pressure points padded. Her back was prepped and draped in a sterile manner. The persistent area of drainage was probed with a blunt dissector. I was able to penetrate the deep fascia with the probe, but there was no purulence found. I did culture the wounds.  I irrigated the wound, and opened the incision to make sure I did not miss a pus pocket or other compromised area. I did not . After irrigating, I closed the wound with vertical mattress sutures. I applied a sterile dressing.   PLAN OF CARE: Admit for overnight observation  PATIENT DISPOSITION:  PACU - hemodynamically stable.   Delay start of Pharmacological VTE agent (>24hrs) due to surgical blood loss or risk of bleeding:  yes

## 2016-01-09 ENCOUNTER — Encounter (HOSPITAL_COMMUNITY): Payer: Self-pay | Admitting: Neurosurgery

## 2016-01-10 ENCOUNTER — Ambulatory Visit (HOSPITAL_BASED_OUTPATIENT_CLINIC_OR_DEPARTMENT_OTHER): Payer: PRIVATE HEALTH INSURANCE | Admitting: Physical Medicine & Rehabilitation

## 2016-01-10 ENCOUNTER — Encounter: Payer: Self-pay | Admitting: Physical Medicine & Rehabilitation

## 2016-01-10 ENCOUNTER — Encounter: Payer: Worker's Compensation | Attending: Physical Medicine & Rehabilitation

## 2016-01-10 DIAGNOSIS — IMO0002 Reserved for concepts with insufficient information to code with codable children: Secondary | ICD-10-CM

## 2016-01-10 DIAGNOSIS — G8220 Paraplegia, unspecified: Secondary | ICD-10-CM | POA: Insufficient documentation

## 2016-01-10 DIAGNOSIS — S22081S Stable burst fracture of T11-T12 vertebra, sequela: Secondary | ICD-10-CM

## 2016-01-10 MED ORDER — BACLOFEN 10 MG PO TABS: 5.0000 mg | ORAL_TABLET | Freq: Three times a day (TID) | ORAL | Status: DC

## 2016-01-10 NOTE — Patient Instructions (Signed)
I would suspect maximal medical improvement around 12 months from the time of injury  I project that she may be old to return to light duty in July  Patient having increasing spasticity particularly of the left foot and ankle area recommend Botox 200 units We'll put 50 units into the EHL and 150 units into the gastroc and soleus  Baclofen 5 mg 3 times a day if causes excessive drowsiness take it only at night
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
## 2016-01-11 LAB — WOUND CULTURE: CULTURE: NO GROWTH

## 2016-01-13 LAB — ANAEROBIC CULTURE

## 2016-01-31 ENCOUNTER — Encounter: Payer: Self-pay | Admitting: Physical Medicine & Rehabilitation

## 2016-01-31 ENCOUNTER — Ambulatory Visit (HOSPITAL_BASED_OUTPATIENT_CLINIC_OR_DEPARTMENT_OTHER): Payer: Worker's Compensation | Admitting: Physical Medicine & Rehabilitation

## 2016-01-31 VITALS — BP 122/50 | HR 85 | Resp 14

## 2016-01-31 DIAGNOSIS — G8220 Paraplegia, unspecified: Secondary | ICD-10-CM | POA: Diagnosis not present

## 2016-01-31 DIAGNOSIS — IMO0002 Reserved for concepts with insufficient information to code with codable children: Secondary | ICD-10-CM

## 2016-01-31 NOTE — Patient Instructions (Signed)

## 2016-01-31 NOTE — Progress Notes (Signed)
Botox Injection for spasticity using needle EMG guidance  Dilution: 50 Units/ml Indication: Severe spasticity which interferes with ADL,mobility and/or  hygiene and is unresponsive to medication management and other conservative care Informed consent was obtained after describing risks and benefits of the procedure with the patient. This includes bleeding, bruising, infection, excessive weakness, or medication side effects. A REMS form is on file and signed. Needle: 27g 1" and 25g 2" needle electrode Number of units per muscle Botox 200 units total Left Gastroc 75 units medial head Left Soleus 50 units lateral, 25 units medial Left EHL 25 units 2 All injections were done after obtaining appropriate EMG activity and after negative drawback for blood. The patient tolerated the procedure well. Post procedure instructions were given. A followup appointment was made.

## 2016-03-13 ENCOUNTER — Encounter: Payer: Worker's Compensation | Attending: Physical Medicine & Rehabilitation

## 2016-03-13 ENCOUNTER — Ambulatory Visit (HOSPITAL_BASED_OUTPATIENT_CLINIC_OR_DEPARTMENT_OTHER): Payer: Worker's Compensation | Admitting: Physical Medicine & Rehabilitation

## 2016-03-13 ENCOUNTER — Encounter: Payer: Self-pay | Admitting: Physical Medicine & Rehabilitation

## 2016-03-13 VITALS — BP 151/79 | HR 71 | Resp 14

## 2016-03-13 DIAGNOSIS — G8220 Paraplegia, unspecified: Secondary | ICD-10-CM | POA: Diagnosis not present

## 2016-03-13 DIAGNOSIS — S22081S Stable burst fracture of T11-T12 vertebra, sequela: Secondary | ICD-10-CM

## 2016-03-13 DIAGNOSIS — IMO0002 Reserved for concepts with insufficient information to code with codable children: Secondary | ICD-10-CM

## 2016-03-13 MED ORDER — BACLOFEN 10 MG PO TABS: 20.0000 mg | ORAL_TABLET | Freq: Three times a day (TID) | ORAL | Status: DC
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
## 2016-03-13 NOTE — Patient Instructions (Signed)
I estimate

## 2016-04-21 ENCOUNTER — Ambulatory Visit (HOSPITAL_BASED_OUTPATIENT_CLINIC_OR_DEPARTMENT_OTHER): Payer: Worker's Compensation | Admitting: Physical Medicine & Rehabilitation

## 2016-04-21 ENCOUNTER — Encounter: Payer: Self-pay | Admitting: Physical Medicine & Rehabilitation

## 2016-04-21 ENCOUNTER — Encounter: Payer: Worker's Compensation | Attending: Physical Medicine & Rehabilitation

## 2016-04-21 VITALS — BP 102/66 | HR 78 | Resp 14

## 2016-04-21 DIAGNOSIS — IMO0002 Reserved for concepts with insufficient information to code with codable children: Secondary | ICD-10-CM

## 2016-04-21 DIAGNOSIS — G8220 Paraplegia, unspecified: Secondary | ICD-10-CM

## 2016-04-21 MED ORDER — BACLOFEN 20 MG PO TABS: 20.0000 mg | ORAL_TABLET | Freq: Three times a day (TID) | ORAL | Status: DC

## 2016-04-21 NOTE — Progress Notes (Signed)
Botox Injection for spasticity using needle EMG guidance  Dilution: 50 Units/ml Indication: Severe spasticity which interferes with ADL,mobility and/or  hygiene and is unresponsive to medication management and other conservative care Informed consent was obtained after describing risks and benefits of the procedure with the patient. This includes bleeding, bruising, infection, excessive weakness, or medication side effects. A REMS form is on file and signed. Needle: 25g 2" needle electrode Number of units per muscle Left Gastroc 75 units medial head, Lateral gastroc 75 units Left Soleus 50 units lateral, 50 units medial Left EHL 25 units 2  All injections were done after obtaining appropriate EMG activity and after negative drawback for blood. The patient tolerated the procedure well. Post procedure instructions were given. A followup appointment was made.

## 2016-04-21 NOTE — Patient Instructions (Signed)
You received a Botox injection today. You may experience soreness at the needle injection sites. Please call us if any of the injection sites turns red after a couple days or if there is any drainage. You may experience muscle weakness as a result of Botox. This would improve with time but can take several weeks to improve. The Botox should start working in about one week. The Botox usually last 3 months. The injection can be repeated every 3 months as needed.  Left Gastroc 75 units medial head, Lateral gastroc 75 units Left Soleus 50 units lateral, 50 units medial Left EHL 25 units 2

## 2016-04-24 ENCOUNTER — Telehealth: Payer: Self-pay | Admitting: Physical Medicine & Rehabilitation

## 2016-04-24 ENCOUNTER — Encounter: Payer: Self-pay | Admitting: Physical Medicine & Rehabilitation

## 2016-04-24 NOTE — Telephone Encounter (Signed)
Please advise on a letter to return to work.
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
## 2016-06-01 IMAGING — DX DG ABD PORTABLE 1V
1 series · 1 of 1 positions shown · non-contrast
Comparison: 11/06/2015 CT abdomen/pelvis.

CLINICAL DATA: Status post posterior thoracolumbar spine fusion for
T12 vertebral fracture. Ileus.

EXAM:
PORTABLE ABDOMEN - 1 VIEW

[abdomen supine]
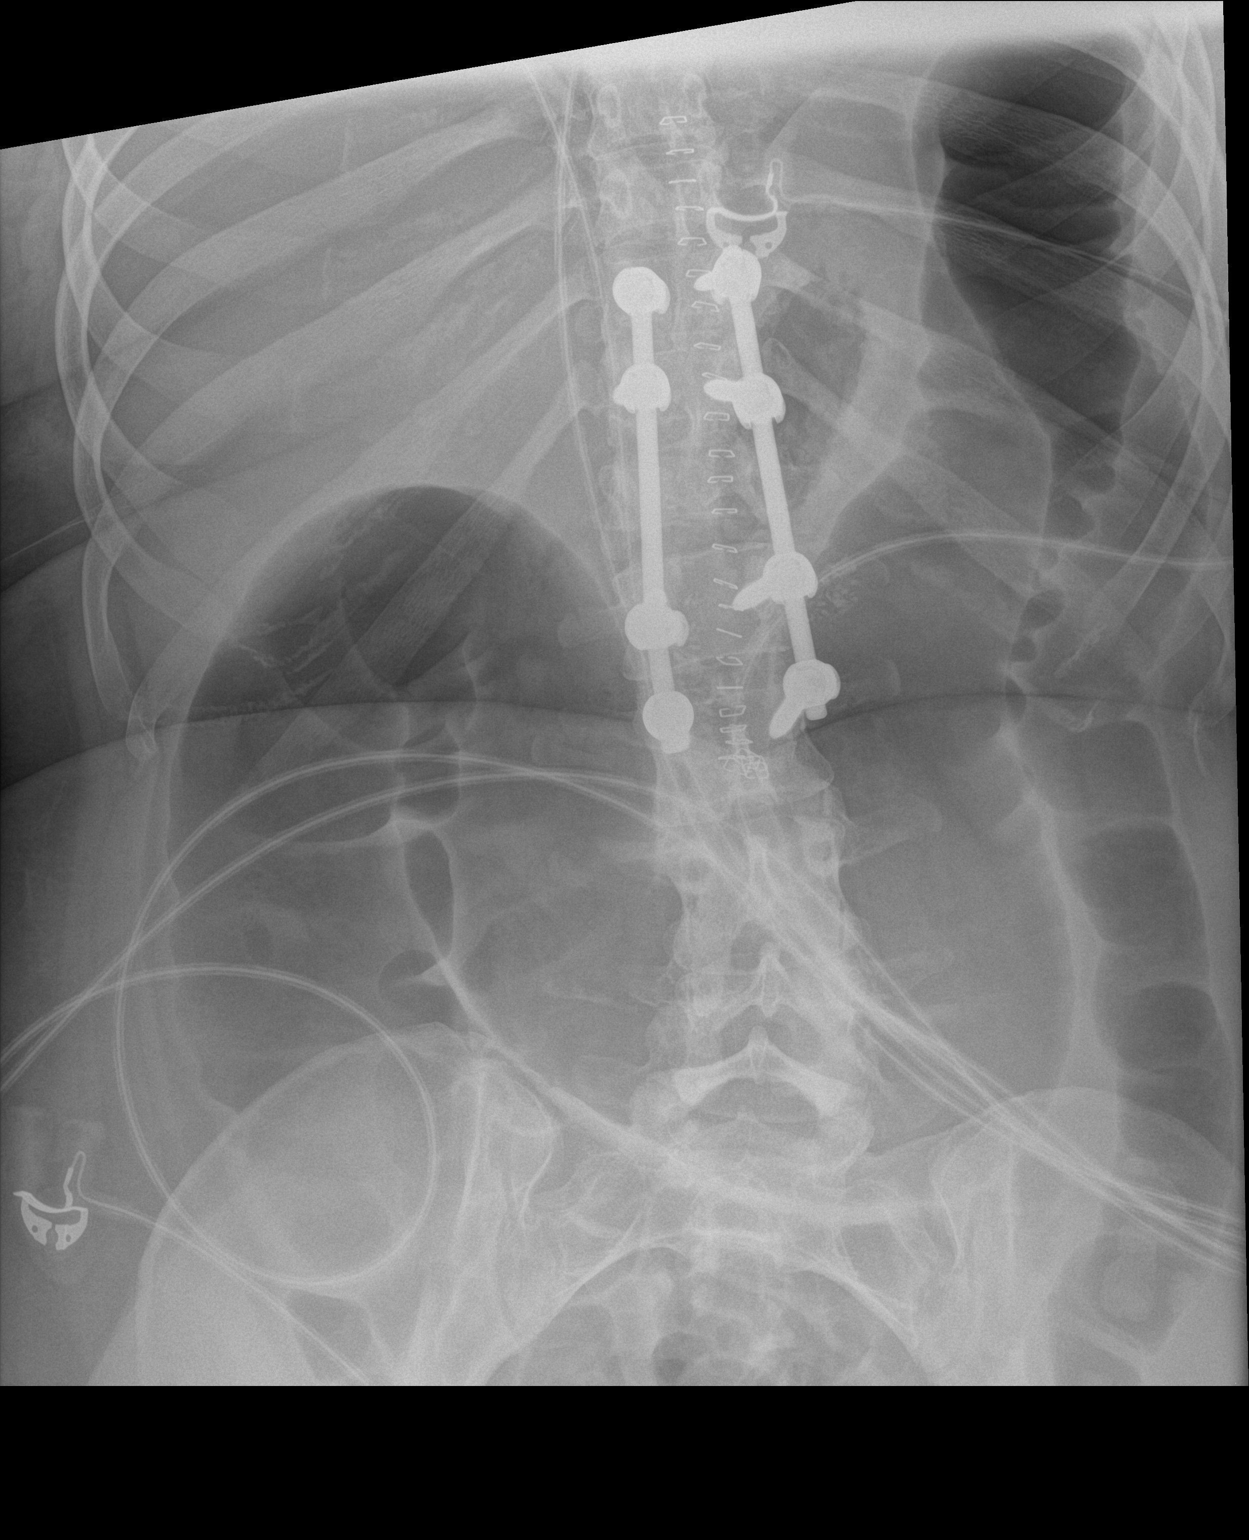

[1 of 1 positions shown; findings below may reference images not displayed]

FINDINGS: There is prominent diffuse gaseous distention of the colon up to
cm diameter in the transverse colon. No appreciable small bowel
dilatation. No evidence of pneumatosis or pneumoperitoneum.
Thoracolumbar spine fusion hardware in midline skin staples are
noted.
IMPRESSION: Prominent diffuse gaseous distention of the colon, in keeping with a
postoperative adynamic ileus.

## 2016-06-09 ENCOUNTER — Encounter: Payer: Worker's Compensation | Attending: Physical Medicine & Rehabilitation

## 2016-06-09 ENCOUNTER — Ambulatory Visit (HOSPITAL_BASED_OUTPATIENT_CLINIC_OR_DEPARTMENT_OTHER): Payer: Worker's Compensation | Admitting: Physical Medicine & Rehabilitation

## 2016-06-09 VITALS — BP 112/80 | HR 86 | Resp 16

## 2016-06-09 DIAGNOSIS — G8220 Paraplegia, unspecified: Secondary | ICD-10-CM | POA: Diagnosis not present

## 2016-06-09 DIAGNOSIS — IMO0002 Reserved for concepts with insufficient information to code with codable children: Secondary | ICD-10-CM
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
## 2016-06-09 NOTE — Patient Instructions (Signed)
We discussed light office work. She's been cleared by neurosurgery for this. I do agree that she can start with 2 hours a day and then workup adding an hour to per day each month  We discussed the next Botox injection as well. We will not inject any Botox into the muscle controlling the big toe would stick with the calf muscles. This would help keep her heel on the floor and also help your foot clear

## 2016-07-21 ENCOUNTER — Ambulatory Visit: Payer: Self-pay | Admitting: Physical Medicine & Rehabilitation

## 2016-07-24 ENCOUNTER — Ambulatory Visit: Payer: Self-pay | Admitting: Physical Medicine & Rehabilitation

## 2016-07-28 ENCOUNTER — Ambulatory Visit: Payer: Worker's Compensation | Admitting: Physical Medicine & Rehabilitation

## 2016-08-03 ENCOUNTER — Encounter: Payer: Worker's Compensation | Attending: Physical Medicine & Rehabilitation

## 2016-08-03 ENCOUNTER — Encounter: Payer: Self-pay | Admitting: Physical Medicine & Rehabilitation

## 2016-08-03 ENCOUNTER — Ambulatory Visit (HOSPITAL_BASED_OUTPATIENT_CLINIC_OR_DEPARTMENT_OTHER): Payer: Worker's Compensation | Admitting: Physical Medicine & Rehabilitation

## 2016-08-03 DIAGNOSIS — G8220 Paraplegia, unspecified: Secondary | ICD-10-CM | POA: Diagnosis not present

## 2016-08-03 DIAGNOSIS — IMO0002 Reserved for concepts with insufficient information to code with codable children: Secondary | ICD-10-CM

## 2016-08-03 NOTE — Progress Notes (Signed)
Botox Injection for spasticity using needle EMG guidance  Dilution: 50 Units/ml Indication: Severe spasticity which interferes with ADL,mobility and/or  hygiene and is unresponsive to medication management and other conservative care Informed consent was obtained after describing risks and benefits of the procedure with the patient. This includes bleeding, bruising, infection, excessive weakness, or medication side effects. A REMS form is on file and signed. Needle: 25g 2" needle electrode Number of units per muscle Left Gastroc 75 units medial head, Lateral gastroc 75 units Left Soleus 50 units lateral, 50 units medial Left EHL 25 units 2  All injections were done after obtaining appropriate EMG activity and after negative drawback for blood. The patient tolerated the procedure well. Post procedure instructions were given. A followup appointment was made.

## 2016-08-03 NOTE — Patient Instructions (Signed)

## 2016-08-04 ENCOUNTER — Telehealth: Payer: Self-pay | Admitting: Physical Medicine & Rehabilitation

## 2016-08-04 ENCOUNTER — Encounter: Payer: Self-pay | Admitting: Physical Medicine & Rehabilitation

## 2016-08-04 NOTE — Telephone Encounter (Signed)
You can send the procedure note from last visit. I did not do a regular office visit

## 2016-08-04 NOTE — Telephone Encounter (Addendum)
Jonne Plyindy Felix patient's case manager needs to get a work note.  The note needs to say that patient can work 3 hours a day and how should she increase this.  Arline AspCindy also needs office note from visit faxed to her as well to [phone].

## 2016-08-05 NOTE — Telephone Encounter (Signed)
Faxed notes and letter

## 2016-08-25 ENCOUNTER — Ambulatory Visit (HOSPITAL_BASED_OUTPATIENT_CLINIC_OR_DEPARTMENT_OTHER): Payer: Worker's Compensation | Admitting: Physical Medicine & Rehabilitation

## 2016-08-25 VITALS — BP 121/83 | HR 80 | Resp 14

## 2016-08-25 DIAGNOSIS — IMO0002 Reserved for concepts with insufficient information to code with codable children: Secondary | ICD-10-CM

## 2016-08-25 DIAGNOSIS — G8220 Paraplegia, unspecified: Secondary | ICD-10-CM
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
## 2016-09-09 ENCOUNTER — Telehealth: Payer: Self-pay | Admitting: Physical Medicine & Rehabilitation

## 2016-10-23 NOTE — Telephone Encounter (Signed)
[facility] -requesting MD opinion on outside activities of ptn.MD advised he only addressing the return to work note as previously described

## 2016-11-06 ENCOUNTER — Encounter: Payer: Worker's Compensation | Attending: Physical Medicine & Rehabilitation

## 2016-11-06 ENCOUNTER — Ambulatory Visit: Payer: Self-pay | Admitting: Physical Medicine & Rehabilitation

## 2016-11-06 DIAGNOSIS — G8220 Paraplegia, unspecified: Secondary | ICD-10-CM | POA: Insufficient documentation
